# Patient Record
Sex: Female | Born: 1980 | Race: Black or African American | Marital: Married | Smoking: Never smoker
Health system: Southern US, Community
[De-identification: ages and names within clinical notes are randomized; demographics above are authoritative.]

## PROBLEM LIST (undated history)

## (undated) ENCOUNTER — Inpatient Hospital Stay (HOSPITAL_COMMUNITY): Payer: Self-pay

## (undated) DIAGNOSIS — N83209 Unspecified ovarian cyst, unspecified side: Secondary | ICD-10-CM

## (undated) DIAGNOSIS — O3480 Maternal care for other abnormalities of pelvic organs, unspecified trimester: Secondary | ICD-10-CM

---

## 2011-03-12 ENCOUNTER — Ambulatory Visit
Admission: RE | Admit: 2011-03-12 | Discharge: 2011-03-12 | Disposition: A | Discharge status: Home / Self Care | Payer: No Typology Code available for payment source | Source: Ambulatory Visit | Attending: Infectious Diseases | Admitting: Infectious Diseases

## 2011-03-12 ENCOUNTER — Other Ambulatory Visit: Payer: Self-pay | Admitting: Infectious Diseases

## 2011-03-12 DIAGNOSIS — Z139 Encounter for screening, unspecified: Secondary | ICD-10-CM

## 2011-07-28 ENCOUNTER — Inpatient Hospital Stay (HOSPITAL_COMMUNITY)
Admission: AD | Admit: 2011-07-28 | Discharge: 2011-07-28 | Disposition: A | Discharge status: Home / Self Care | Payer: BC Managed Care – PPO | Source: Ambulatory Visit | Attending: Obstetrics & Gynecology | Admitting: Obstetrics & Gynecology

## 2011-07-28 ENCOUNTER — Encounter (HOSPITAL_COMMUNITY): Payer: Self-pay | Admitting: *Deleted

## 2011-07-28 ENCOUNTER — Inpatient Hospital Stay (HOSPITAL_COMMUNITY): Payer: BC Managed Care – PPO

## 2011-07-28 DIAGNOSIS — N83209 Unspecified ovarian cyst, unspecified side: Secondary | ICD-10-CM

## 2011-07-28 DIAGNOSIS — O34599 Maternal care for other abnormalities of gravid uterus, unspecified trimester: Secondary | ICD-10-CM | POA: Insufficient documentation

## 2011-07-28 DIAGNOSIS — Z1389 Encounter for screening for other disorder: Secondary | ICD-10-CM

## 2011-07-28 DIAGNOSIS — Z363 Encounter for antenatal screening for malformations: Secondary | ICD-10-CM

## 2011-07-28 DIAGNOSIS — Z3490 Encounter for supervision of normal pregnancy, unspecified, unspecified trimester: Secondary | ICD-10-CM

## 2011-07-28 DIAGNOSIS — N83202 Unspecified ovarian cyst, left side: Secondary | ICD-10-CM

## 2011-07-28 DIAGNOSIS — R109 Unspecified abdominal pain: Secondary | ICD-10-CM | POA: Insufficient documentation

## 2011-07-28 LAB — WET PREP, GENITAL: Trich, Wet Prep: NONE SEEN

## 2011-07-28 LAB — CBC
HCT: 35.2 % — ABNORMAL LOW (ref 36.0–46.0)
MCHC: 31.8 g/dL (ref 30.0–36.0)
RDW: 13.6 % (ref 11.5–15.5)

## 2011-07-28 LAB — URINALYSIS, ROUTINE W REFLEX MICROSCOPIC
Bilirubin Urine: NEGATIVE
Hgb urine dipstick: NEGATIVE
Specific Gravity, Urine: 1.01 (ref 1.005–1.030)
pH: 6 (ref 5.0–8.0)

## 2011-07-28 LAB — POCT PREGNANCY, URINE
Preg Test, Ur: POSITIVE
Preg Test, Ur: POSITIVE

## 2011-07-28 NOTE — ED Provider Notes (Signed)
History   Pt presents today c/o lower abd pain that has been present for the past 2wks. She states she has noticed worsening pain in the lower quadrants. She denies vag dc or bleeding.  Chief Complaint  Patient presents with  . Abdominal Pain   HPI  OB History    Grav Para Term Preterm Abortions TAB SAB Ect Mult Living   3 1              Past Medical History  Diagnosis Date  . No pertinent past medical history     Past Surgical History  Procedure Date  . Cesarean section     No family history on file.  History  Substance Use Topics  . Smoking status: Not on file  . Smokeless tobacco: Not on file  . Alcohol Use: Not on file    Allergies: No Known Allergies  Prescriptions prior to admission  Medication Sig Dispense Refill  . prenatal vitamin w/FE, FA (PRENATAL 1 + 1) 27-1 MG TABS Take 1 tablet by mouth daily.          Review of Systems  Constitutional: Negative for fever and chills.  Cardiovascular: Negative for chest pain.  Gastrointestinal: Positive for abdominal pain. Negative for nausea, vomiting, diarrhea and constipation.  Genitourinary: Negative for dysuria, urgency, frequency and hematuria.  Neurological: Negative for dizziness and headaches.  Psychiatric/Behavioral: Negative for depression and suicidal ideas.   Physical Exam   Blood pressure 126/65, pulse 78, temperature 97.7 F (36.5 C), temperature source Oral, resp. rate 16, height 5' 6.5" (1.689 m), weight 167 lb 12.8 oz (76.114 kg), last menstrual period 06/10/2011, SpO2 100.00%.  Physical Exam  Constitutional: She is oriented to person, place, and time. She appears well-developed and well-nourished. No distress.  HENT:  Head: Normocephalic and atraumatic.  Eyes: EOM are normal. Pupils are equal, round, and reactive to light.  GI: Soft. She exhibits no distension. There is tenderness. There is no rebound and no guarding.  Genitourinary: No tenderness or bleeding around the vagina. Vaginal  discharge found.       Cervix Lg/closed. Pt is slightly tender in both adnexa.  Neurological: She is alert and oriented to person, place, and time.  Skin: Skin is warm and dry. She is not diaphoretic.  Psychiatric: She has a normal mood and affect. Her behavior is normal. Judgment and thought content normal.    MAU Course  Procedures  Results for orders placed during the hospital encounter of 07/28/11 (from the past 24 hour(s))  URINALYSIS, ROUTINE W REFLEX MICROSCOPIC     Status: Normal   Collection Time   07/28/11 10:40 AM      Component Value Range   Color, Urine YELLOW  YELLOW    Appearance CLEAR  CLEAR    Specific Gravity, Urine 1.010  1.005 - 1.030    pH 6.0  5.0 - 8.0    Glucose, UA NEGATIVE  NEGATIVE (mg/dL)   Hgb urine dipstick NEGATIVE  NEGATIVE    Bilirubin Urine NEGATIVE  NEGATIVE    Ketones, ur NEGATIVE  NEGATIVE (mg/dL)   Protein, ur NEGATIVE  NEGATIVE (mg/dL)   Urobilinogen, UA 0.2  0.0 - 1.0 (mg/dL)   Nitrite NEGATIVE  NEGATIVE    Leukocytes, UA NEGATIVE  NEGATIVE   POCT PREGNANCY, URINE     Status: Normal   Collection Time   07/28/11 10:43 AM      Component Value Range   Preg Test, Ur POSITIVE    POCT PREGNANCY,  URINE     Status: Normal   Collection Time   07/28/11 10:44 AM      Component Value Range   Preg Test, Ur POSITIVE    CBC     Status: Abnormal   Collection Time   07/28/11 10:51 AM      Component Value Range   WBC 4.5  4.0 - 10.5 (K/uL)   RBC 4.30  3.87 - 5.11 (MIL/uL)   Hemoglobin 11.2 (*) 12.0 - 15.0 (g/dL)   HCT 40.9 (*) 81.1 - 46.0 (%)   MCV 81.9  78.0 - 100.0 (fL)   MCH 26.0  26.0 - 34.0 (pg)   MCHC 31.8  30.0 - 36.0 (g/dL)   RDW 91.4  78.2 - 95.6 (%)   Platelets 238  150 - 400 (K/uL)  WET PREP, GENITAL     Status: Abnormal   Collection Time   07/28/11 11:00 AM      Component Value Range   Yeast, Wet Prep NONE SEEN  NONE SEEN    Trich, Wet Prep NONE SEEN  NONE SEEN    Clue Cells, Wet Prep FEW (*) NONE SEEN    WBC, Wet Prep HPF POC  MODERATE (*) NONE SEEN   HCG, QUANTITATIVE, PREGNANCY     Status: Abnormal   Collection Time   07/28/11 11:00 AM      Component Value Range   hCG, Beta Chain, Quant, S 21308 (*) <5 (mIU/mL)   US shows single IUP at 6.5wks with EDD of 03/18/12. Also there is a large 8.7cm septated left ovarian mass. Recommend repeat US in 6wks.  Assessment and Plan  Pain in preg: pt with large septated left ovarian cyst. Will repeat US in 6wks. Discussed diet, activity, risks, and precautions.  Clinton Gallant. Kamoni Gentles III, DrHSc, MPAS, PA-C  07/28/2011, 11:27 AM

## 2011-07-28 NOTE — Progress Notes (Signed)
Lower abd pain x 2 weeks, UPT positive

## 2011-07-28 NOTE — Progress Notes (Signed)
Pt states she started having lower abdominal pain about 2 weeks ago. Sometimes nauseated but no vomiting. No bleeding or discharge.

## 2011-07-29 LAB — GC/CHLAMYDIA PROBE AMP, GENITAL
Chlamydia, DNA Probe: NEGATIVE
GC Probe Amp, Genital: NEGATIVE

## 2011-08-02 NOTE — ED Provider Notes (Signed)
Agree with above note.  Sheena Hernandez H. 08/02/2011 5:08 PM

## 2011-08-14 DIAGNOSIS — N83209 Unspecified ovarian cyst, unspecified side: Secondary | ICD-10-CM

## 2011-08-14 HISTORY — DX: Unspecified ovarian cyst, unspecified side: N83.209

## 2011-08-15 ENCOUNTER — Other Ambulatory Visit: Payer: Self-pay | Admitting: Physician Assistant

## 2011-08-17 ENCOUNTER — Encounter (HOSPITAL_COMMUNITY): Payer: Self-pay | Admitting: Anesthesiology

## 2011-08-17 ENCOUNTER — Observation Stay (HOSPITAL_COMMUNITY)
Admission: AD | Admit: 2011-08-17 | Discharge: 2011-08-18 | Disposition: A | Discharge status: Home / Self Care | Payer: Medicaid Other | Source: Ambulatory Visit | Attending: Obstetrics & Gynecology | Admitting: Obstetrics & Gynecology

## 2011-08-17 ENCOUNTER — Encounter (HOSPITAL_COMMUNITY): Payer: Self-pay | Admitting: *Deleted

## 2011-08-17 ENCOUNTER — Inpatient Hospital Stay (HOSPITAL_COMMUNITY): Payer: Medicaid Other

## 2011-08-17 DIAGNOSIS — N83209 Unspecified ovarian cyst, unspecified side: Secondary | ICD-10-CM | POA: Insufficient documentation

## 2011-08-17 DIAGNOSIS — N83202 Unspecified ovarian cyst, left side: Secondary | ICD-10-CM

## 2011-08-17 DIAGNOSIS — O34599 Maternal care for other abnormalities of gravid uterus, unspecified trimester: Principal | ICD-10-CM | POA: Insufficient documentation

## 2011-08-17 DIAGNOSIS — R1032 Left lower quadrant pain: Secondary | ICD-10-CM | POA: Insufficient documentation

## 2011-08-17 DIAGNOSIS — N949 Unspecified condition associated with female genital organs and menstrual cycle: Secondary | ICD-10-CM | POA: Insufficient documentation

## 2011-08-17 DIAGNOSIS — Z3490 Encounter for supervision of normal pregnancy, unspecified, unspecified trimester: Secondary | ICD-10-CM

## 2011-08-17 MED ORDER — MORPHINE SULFATE 4 MG/ML IJ SOLN
4.0000 mg | Freq: Once | INTRAMUSCULAR | Status: AC
  Administered 2011-08-17: 4 mg via INTRAVENOUS
  Filled 2011-08-17: qty 1

## 2011-08-17 MED ORDER — CEFAZOLIN SODIUM 1-5 GM-% IV SOLN
1.0000 g | INTRAVENOUS | Status: DC
  Filled 2011-08-17: qty 50

## 2011-08-17 MED ORDER — OXYCODONE-ACETAMINOPHEN 5-325 MG PO TABS: 2.0000 | ORAL_TABLET | ORAL | Status: DC | PRN

## 2011-08-17 MED ORDER — DEXTROSE IN LACTATED RINGERS 5 % IV SOLN
INTRAVENOUS | Status: DC
  Administered 2011-08-17 – 2011-08-18 (×2): via INTRAVENOUS

## 2011-08-17 MED ORDER — MORPHINE SULFATE 4 MG/ML IJ SOLN
3.0000 mg | INTRAMUSCULAR | Status: DC | PRN
  Filled 2011-08-17: qty 1

## 2011-08-17 MED ORDER — MORPHINE SULFATE 4 MG/ML IJ SOLN
4.0000 mg | Freq: Once | INTRAMUSCULAR | Status: AC
  Administered 2011-08-17: 4 mg via INTRAVENOUS

## 2011-08-17 MED ORDER — MORPHINE SULFATE 4 MG/ML IJ SOLN
2.0000 mg | Freq: Once | INTRAMUSCULAR | Status: DC
  Filled 2011-08-17: qty 1

## 2011-08-17 MED ORDER — ONDANSETRON 4 MG PO TBDP
4.0000 mg | ORAL_TABLET | Freq: Once | ORAL | Status: AC
  Administered 2011-08-17: 4 mg via ORAL
  Filled 2011-08-17: qty 1

## 2011-08-17 MED ORDER — SODIUM CHLORIDE 0.9 % IJ SOLN
INTRAMUSCULAR | Status: AC
  Filled 2011-08-17: qty 6

## 2011-08-17 NOTE — Progress Notes (Signed)
DR. [name] AT BEDSIDE RISKS OF SURGERY EXPLAINED TO PT. PT WILL LET RN KNOW IF SHE WANTS TO BE ADMITTED FOR OBSERVATION AND HAVE SURGERY IN AM. PT VERBALIZED UNDERSTANDING.

## 2011-08-17 NOTE — Progress Notes (Signed)
PT REQUESTING TO SEE DR. DOVE AND TALK ABOUT RISK OF SURGERY. DR. [name] NOTIFIED OF PT CONCERN AND WILL COME TO MAU TO TALK WITH PT.

## 2011-08-17 NOTE — Progress Notes (Signed)
Pt c/o lower abdominal pain that started today. No vaginal bleeding or discharge.

## 2011-08-17 NOTE — ED Provider Notes (Addendum)
Subjective  The patient is a 30 year old G4 P2002 at 9 weeks 5 days presenting with acute onset severe left lower quadrant pain about 30 minutes prior to arrival. She had a similar pain episode of less severe when she was seen here on 07/28/2011 and her ultrasound revealed a viable 6 week 6 day viable IUP and a large septated left ovarian cyst measuring 8.7 cm x 6.7 cm. There is a trace of free fluid. Since that visit she has continued to have mild lower abdominal pain but this is the worst it has been. No vaginal bleeding.  Past Medical History  Diagnosis Date  . No pertinent past medical history    Past Surgical History  Procedure Date  . Cesarean section    History   Social History  . Marital Status: Married    Spouse Name: N/A    Number of Children: N/A  . Years of Education: N/A   Social History Main Topics  . Smoking status: Never Smoker   . Smokeless tobacco: Not on file  . Alcohol Use: No  . Drug Use: No  . Sexually Active: Yes   Other Topics Concern  . Not on file   Social History Narrative  . No narrative on file   Review of symptoms: Pertinent in items in history of present illness  Objective Filed Vitals:   08/17/11 1916  BP: 103/84  Pulse: 67  Temp: 97.7 F (36.5 C)  Resp: 20   Physical exam  General: The patient is writhing on the floor in apparent pain and unable to lay on the bed for bedside ultrasound. Abd: soft, moderately TTP L>RLQ  [place]: Left ovarian cysts increased in size to 12x11x5 cm and there is blood flow ruling out complete torsion.  C/W Dr. [name] who will see pt.  I saw the patient and her husband.  I explained that since her cyst has grown since the last ultrasound, that I would recommend surgery. I offered to do it now versus overnight stay and operate in the AM.  She and her husband feel that this is "abrupt", and they have deferred not only surgery, but also admission. I have suggested that instead of the health dept., that she should  call [facility] in the AM and get an appt. I told her to come back to the MAU anytime that she feels her pain is worse.    Now (2117) she has decided that she would like overnight observation and to have her surgery tomorrow.  I have certainly agreed and the OR says this can be done at 1145am.

## 2011-08-17 NOTE — H&P (Signed)
  Sheena Hernandez C. Sheena Potter, MD Physician Signed Obstetrics ED Provider Notes 08/17/2011 6:41 PM  Subjective   The patient is a 30 year old G4 P2002 at 9 weeks 5 days presenting with acute onset severe left lower quadrant pain about 30 minutes prior to arrival. She had a similar pain episode of less severe when she was seen here on 07/28/2011 and her ultrasound revealed a viable 6 week 6 day viable IUP and a large septated left ovarian cyst measuring 8.7 cm x 6.7 cm. There is a trace of free fluid. Since that visit she has continued to have mild lower abdominal pain but this is the worst it has been. No vaginal bleeding.    Past Medical History   Diagnosis  Date   .  No pertinent past medical history      Past Surgical History   Procedure  Date   .  Cesarean section      History       Social History   .  Marital Status:  Married       Spouse Name:  N/A       Number of Children:  N/A   .  Years of Education:  N/A       Social History Main Topics   .  Smoking status:  Never Smoker    .  Smokeless tobacco:  Not on file   .  Alcohol Use:  No   .  Drug Use:  No   .  Sexually Active:  Yes       Other Topics  Concern   .  Not on file       Social History Narrative   .  No narrative on file    Review of symptoms: Pertinent in items in history of present illness   Objective Filed Vitals:     08/17/11 1916   BP:  103/84   Pulse:  67   Temp:  97.7 F (36.5 C)   Resp:  20    Physical exam   General: The patient is writhing on the floor in apparent pain and unable to lay on the bed for bedside ultrasound. Abd: soft, moderately TTP L>RLQ   [place]: Left ovarian cysts increased in size to 12x11x5 cm and there is blood flow ruling out complete torsion.   C/W Dr. Marice Hernandez who will see pt.   I saw the patient and her husband.  I explained that since her cyst has grown since the last ultrasound, that I would recommend surgery. I offered to do it now versus overnight stay and operate in the AM.   She and her husband feel that this is "abrupt", and they have deferred not only surgery, but also admission. I have suggested that instead of the health dept., that she should call the [facility] in the AM and get an appt. I told her to come back to the MAU anytime that she feels her pain is worse.   As of 2117, the patient and her husband have decided that they will accept overnight observation and surgery in the AM.  The OR manager Elnita Maxwell say that an 1145 time slot is available.

## 2011-08-18 ENCOUNTER — Encounter: Payer: Self-pay | Admitting: Advanced Practice Midwife

## 2011-08-18 ENCOUNTER — Encounter (HOSPITAL_COMMUNITY)
Admission: AD | Disposition: A | Discharge status: Home / Self Care | Payer: Self-pay | Source: Ambulatory Visit | Attending: Obstetrics & Gynecology

## 2011-08-18 DIAGNOSIS — Z3490 Encounter for supervision of normal pregnancy, unspecified, unspecified trimester: Secondary | ICD-10-CM

## 2011-08-18 DIAGNOSIS — N83209 Unspecified ovarian cyst, unspecified side: Secondary | ICD-10-CM

## 2011-08-18 LAB — COMPREHENSIVE METABOLIC PANEL
ALT: 15 U/L (ref 0–35)
AST: 14 U/L (ref 0–37)
CO2: 25 mEq/L (ref 19–32)
Calcium: 9 mg/dL (ref 8.4–10.5)
GFR calc non Af Amer: >60 mL/min (ref >60)
Sodium: 134 mEq/L — ABNORMAL LOW (ref 135–145)

## 2011-08-18 LAB — CBC
MCH: 26.1 pg (ref 26.0–34.0)
Platelets: 261 10*3/uL (ref 150–400)
RBC: 4.18 MIL/uL (ref 3.87–5.11)
WBC: 8 10*3/uL (ref 4.0–10.5)

## 2011-08-18 MED ORDER — ONDANSETRON HCL 4 MG/2ML IJ SOLN
INTRAMUSCULAR | Status: AC
  Filled 2011-08-18: qty 2

## 2011-08-18 MED ORDER — LIDOCAINE HCL (CARDIAC) 20 MG/ML IV SOLN
INTRAVENOUS | Status: AC
  Filled 2011-08-18: qty 5

## 2011-08-18 MED ORDER — FENTANYL CITRATE 0.05 MG/ML IJ SOLN
INTRAMUSCULAR | Status: AC
  Filled 2011-08-18: qty 10

## 2011-08-18 MED ORDER — OXYCODONE-ACETAMINOPHEN 5-325 MG PO TABS: 1.0000 | ORAL_TABLET | ORAL | Status: AC | PRN

## 2011-08-18 MED ORDER — PROPOFOL 10 MG/ML IV EMUL
INTRAVENOUS | Status: AC
  Filled 2011-08-18: qty 20

## 2011-08-18 MED ORDER — ROCURONIUM BROMIDE 50 MG/5ML IV SOLN
INTRAVENOUS | Status: AC
  Filled 2011-08-18: qty 1

## 2011-08-18 MED ORDER — MIDAZOLAM HCL 2 MG/2ML IJ SOLN
INTRAMUSCULAR | Status: AC
  Filled 2011-08-18: qty 2

## 2011-08-18 MED ORDER — DEXAMETHASONE SODIUM PHOSPHATE 10 MG/ML IJ SOLN
INTRAMUSCULAR | Status: AC
  Filled 2011-08-18: qty 1

## 2011-08-18 NOTE — Progress Notes (Signed)
UR chart review completed.  

## 2011-08-18 NOTE — Progress Notes (Signed)
Subjective: Patient reports no problems voiding.  Her pain is somewhat reduced  Objective: I have reviewed patient's vital signs, intake and output, medications and labs.  General: alert Resp: clear to auscultation bilaterally Cardio: regular rate and rhythm, S1, S2 normal, no murmur, click, rub or gallop GI: soft, non-tender; bowel sounds normal; no masses,  no organomegaly and normal findings: bowel sounds normal   Assessment/Plan: 12 cm ovarian cyst at 9 wk 6 days EGA- She is scheduled for a L/S and ovarian cystectomy today.  LOS: 1 day    Tam Delisle C. 08/18/2011, 7:36 AM

## 2011-08-18 NOTE — Discharge Summary (Signed)
Discharge Summary Reason for Admission: left ovarian cyst with severe pain  29 y.o. G4P2002 at [redacted]w[redacted]d. Pt presented to MAU on 08/17/11 with severe pelvic pain. Was seen on 07/28/11 with same complaint and found to have left ovarian cyst measuring 8.7 x 6.7 cm, either one septated cyst or two separate cysts. U/S yesterday revealed that the cyst had increased in size to 12 x 5 x 11 cm, ovarian torsion was ruled out. Pt was offered surgical intervention for ovarian cyst, initially consented, but then declined this morning as her pain is currently resolved. During her admission, she received IV morphine for pain.   Hemoglobin  Date Value Range Status  08/18/2011 10.9* 12.0-15.0 (g/dL) Final     HCT  Date Value Range Status  08/18/2011 33.9* 36.0-46.0 (%) Final    Discharge Diagnoses: IUP at 9.6 wks, left ovarian cyst  Discharge Information: Date: 08/18/2011 Activity: unrestricted Diet: routine Medications: Percocet Condition: stable Instructions: Follow up in clinic in 1 week, repeat u/s in 2 weeks, return to MAU with severe pain Discharge to: home Follow-up Information    Follow up with [facility] on 08/25/2011. (9:30 AM,  [phone])    Contact information:   [address] [place] [id number]            FRAZIER,NATALIE 08/18/2011, 10:30 AM

## 2011-08-18 NOTE — Progress Notes (Signed)
  U/S reviewed with Rads.  Pt. Is w/o pain, on no pain meds overnight and does not want surgery.  She is in the 1st trimester and has flow to her enlarged ovary.  Discussed risk of torsion, possible resolution of cyst and safety of surgery in second trimester.  After careful consideration, pt. Opts to forego surgery at this time.  Will f/u u/s in 2 wks, and plan definitive surgery, if needed at that time.  She is reminded to return to hospital if pain worsens. She will be discharged with some pain meds. And instructions given.

## 2011-08-18 NOTE — Anesthesia Preprocedure Evaluation (Deleted)
Anesthesia Evaluation  Name, MR# and DOB Patient awake  General Assessment Comment  Reviewed: Allergy & Precautions, H&P , Patient's Chart, lab work & pertinent test results, reviewed documented beta blocker date and time   Airway Mallampati: II TM Distance: >3 FB Neck ROM: full    Dental No notable dental hx.    Pulmonary  clear to auscultation  pulmonary exam normalPulmonary Exam Normal breath sounds clear to auscultation none    Cardiovascular regular Normal    Neuro/Psych   GI/Hepatic/Renal   Endo/Other    Abdominal   Musculoskeletal   Hematology   Peds  Reproductive/Obstetrics    Anesthesia Other Findings             Anesthesia Physical Anesthesia Plan  ASA: II  Anesthesia Plan: General   Post-op Pain Management:    Induction: Intravenous  Airway Management Planned: Oral ETT  Additional Equipment:   Intra-op Plan:   Post-operative Plan:   Informed Consent: I have reviewed the patients History and Physical, chart, labs and discussed the procedure including the risks, benefits and alternatives for the proposed anesthesia with the patient or authorized representative who has indicated his/her understanding and acceptance.   Dental Advisory Given  Plan Discussed with: CRNA and Surgeon  Anesthesia Plan Comments: (  Discussed  general anesthesia, including possible nausea, instrumentation of airway, sore throat,pulmonary aspiration, etc. I asked if the were any outstanding questions, or  concerns before we proceeded. )        Anesthesia Quick Evaluation  

## 2011-08-19 ENCOUNTER — Encounter (HOSPITAL_COMMUNITY): Payer: Self-pay | Admitting: Family Medicine

## 2011-08-22 ENCOUNTER — Inpatient Hospital Stay (HOSPITAL_COMMUNITY): Payer: Medicaid Other

## 2011-08-22 ENCOUNTER — Encounter (HOSPITAL_COMMUNITY): Payer: Self-pay | Admitting: *Deleted

## 2011-08-22 ENCOUNTER — Inpatient Hospital Stay (HOSPITAL_COMMUNITY)
Admission: AD | Admit: 2011-08-22 | Discharge: 2011-08-22 | Disposition: A | Discharge status: Home / Self Care | Payer: Medicaid Other | Source: Ambulatory Visit | Attending: Obstetrics & Gynecology | Admitting: Obstetrics & Gynecology

## 2011-08-22 DIAGNOSIS — O039 Complete or unspecified spontaneous abortion without complication: Secondary | ICD-10-CM

## 2011-08-22 DIAGNOSIS — O34599 Maternal care for other abnormalities of gravid uterus, unspecified trimester: Secondary | ICD-10-CM | POA: Insufficient documentation

## 2011-08-22 DIAGNOSIS — N83209 Unspecified ovarian cyst, unspecified side: Secondary | ICD-10-CM | POA: Insufficient documentation

## 2011-08-22 LAB — URINE MICROSCOPIC-ADD ON

## 2011-08-22 LAB — CBC
Platelets: 227 10*3/uL (ref 150–400)
RBC: 4.55 MIL/uL (ref 3.87–5.11)
WBC: 5 10*3/uL (ref 4.0–10.5)

## 2011-08-22 LAB — URINALYSIS, ROUTINE W REFLEX MICROSCOPIC
Bilirubin Urine: NEGATIVE
Glucose, UA: NEGATIVE mg/dL
Ketones, ur: NEGATIVE mg/dL
Leukocytes, UA: NEGATIVE
Nitrite: NEGATIVE
Protein, ur: 30 mg/dL — AB
Specific Gravity, Urine: <1.005 — ABNORMAL LOW (ref 1.005–1.030)
Urobilinogen, UA: 0.2 mg/dL (ref 0.0–1.0)
pH: 6 (ref 5.0–8.0)

## 2011-08-22 MED ORDER — OXYCODONE-ACETAMINOPHEN 10-325 MG PO TABS: 1.0000 | ORAL_TABLET | Freq: Four times a day (QID) | ORAL | Status: AC | PRN

## 2011-08-22 MED ORDER — KETOROLAC TROMETHAMINE 60 MG/2ML IM SOLN
60.0000 mg | Freq: Once | INTRAMUSCULAR | Status: AC
  Administered 2011-08-22: 60 mg via INTRAMUSCULAR
  Filled 2011-08-22: qty 2

## 2011-08-22 NOTE — ED Provider Notes (Signed)
Chief Complaint:  Abdominal Pain and Vaginal Bleeding   Sheena Hernandez is  30 y.o. [id number].  Patient's last menstrual period was 06/10/2011.Marland Kitchen  Her pregnancy status is positive. [redacted]w[redacted]d She presents complaining of Abdominal Pain and Vaginal Bleeding . Onset is described as sudden and has been present for  4 hours. Reports lower abd pain and bleeding with clots since ~ 2am, states took oxycodone without relief. Pt with known left adnexal cyst measuring 12cm. Reports pain today different than previous cyst pain.   Obstetrical/Gynecological History: OB History    Grav Para Term Preterm Abortions TAB SAB Ect Mult Living   4 3 2       2       Past Medical History: Past Medical History  Diagnosis Date  . No pertinent past medical history     Past Surgical History: Past Surgical History  Procedure Date  . Cesarean section   . Laparoscopy 08/18/2011    Procedure: LAPAROSCOPY OPERATIVE;  Surgeon: Reva Bores, MD;  Location: [facility];  Service: Gynecology;  Laterality: N/A;    Family History: No family history on file.  Social History: History  Substance Use Topics  . Smoking status: Never Smoker   . Smokeless tobacco: Not on file  . Alcohol Use: No    Allergies: No Known Allergies  Prescriptions prior to admission  Medication Sig Dispense Refill  . oxyCODONE-acetaminophen (PERCOCET) 5-325 MG per tablet Take 1-2 tablets by mouth every 4 (four) hours as needed for pain.  15 tablet  0  . prenatal vitamin w/FE, FA (PRENATAL 1 + 1) 27-1 MG TABS Take 1 tablet by mouth daily.         Review of Systems - History obtained from the patient General ROS: negative Gastrointestinal ROS: positive for - abdominal pain Genito-Urinary ROS: positive for - vaginal bleeding  Physical Exam   Blood pressure 113/76, pulse 91, temperature 98.8 F (37.1 C), resp. rate 20, height 5' 6.5" (1.689 m), weight 70.943 kg (156 lb 6.4 oz), last menstrual period 06/10/2011.  General: General appearance -  alert, well appearing, and in no distress and normal appearing weight Mental status - alert, oriented to person, place, and time, normal mood, behavior, speech, dress, motor activity, and thought processes, affect appropriate to mood Abdomen - soft, nontender, nondistended, no masses or organomegaly Focused Gynecological Exam: VULVA: blood stained, VAGINA: large amount of blood and tissue in vault, CERVIX: probable placental tissue removed from cervix, cervix dilated 1.5cm, UTERUS: enlarged, nontender, ADNEXA: mass present left side, size 12 cm  Labs: Recent Results (from the past 24 hour(s))  URINALYSIS, ROUTINE W REFLEX MICROSCOPIC   Collection Time   08/22/11  5:45 AM      Component Value Range   Color, Urine RED (*) YELLOW    Appearance CLEAR  CLEAR    Specific Gravity, Urine <1.005 (*) 1.005 - 1.030    pH 6.0  5.0 - 8.0    Glucose, UA NEGATIVE  NEGATIVE (mg/dL)   Hgb urine dipstick LARGE (*) NEGATIVE    Bilirubin Urine NEGATIVE  NEGATIVE    Ketones, ur NEGATIVE  NEGATIVE (mg/dL)   Protein, ur 30 (*) NEGATIVE (mg/dL)   Urobilinogen, UA 0.2  0.0 - 1.0 (mg/dL)   Nitrite NEGATIVE  NEGATIVE    Leukocytes, UA NEGATIVE  NEGATIVE   URINE MICROSCOPIC-ADD ON   Collection Time   08/22/11  5:45 AM      Component Value Range   Squamous Epithelial / LPF FEW (*) RARE  WBC, UA 0-2  <3 (WBC/hpf)   RBC / HPF 7-10  <3 (RBC/hpf)   Bacteria, UA FEW (*) RARE    Imaging Studies:  *RADIOLOGY REPORT*  Clinical Data: Evaluate for retained products of conception  TRANSVAGINAL OBSTETRIC US  Technique: Transvaginal ultrasound was performed for complete  evaluation of the gestation as well as the maternal uterus, adnexal  regions, and pelvic cul-de-sac.  Comparison: 08/17/2011  Intrauterine gestational sac: Not visualized.  Maternal uterus/adnexae:  Uterus measures 5.8 x 6.2 x 9.8 cm.  Endometrium is heterogeneous, measuring 2.4 cm in thickness,  without evidence of focal Doppler flow.  Right ovary  is not discretely visualized.  Left ovary is notable for two simple appearing cysts measuring 5.8  x 3.7 x 4.3 cm and 6.6 x 4.9 x 5.4 cm.  IMPRESSION:  No intrauterine gestational sac visualized.  Heterogeneous endometrium, measuring 2.4 cm in thickness, without  focal Doppler flow. This appearance is suspicious for nonvascular  retained products of conception.  Original Report Authenticated By: Charline Bills, M.D.   Assessment: Complete SAB Stable left ovarian cysts  Plan: Discharge to home Rx Percocet FU in [facility], clinic staff will call with your appt  Sheena Hernandez E. 08/22/2011,6:35 AM

## 2011-08-22 NOTE — ED Notes (Signed)
Pt to u/s via w/c

## 2011-08-22 NOTE — ED Notes (Signed)
[address] CNM in. Spec exam done and POC removed from cervix and sent to lab. Pt tol well.

## 2011-08-22 NOTE — ED Notes (Signed)
[address] CNM in to see pt

## 2011-08-22 NOTE — ED Notes (Signed)
[address] CNM notified of pt's admission and status. Will see pt

## 2011-08-22 NOTE — Progress Notes (Signed)
G3P2 at 4 wks. Bleeding and cramping since 2400. Small amt bleeding with some clots

## 2011-09-01 ENCOUNTER — Ambulatory Visit: Payer: Self-pay | Admitting: Physician Assistant

## 2011-09-01 ENCOUNTER — Ambulatory Visit (INDEPENDENT_AMBULATORY_CARE_PROVIDER_SITE_OTHER): Payer: Medicaid Other | Admitting: Physician Assistant

## 2011-09-01 DIAGNOSIS — N83202 Unspecified ovarian cyst, left side: Secondary | ICD-10-CM

## 2011-09-01 DIAGNOSIS — N83209 Unspecified ovarian cyst, unspecified side: Secondary | ICD-10-CM

## 2011-09-01 DIAGNOSIS — O039 Complete or unspecified spontaneous abortion without complication: Secondary | ICD-10-CM

## 2011-09-01 LAB — HCG, QUANTITATIVE, PREGNANCY: hCG, Beta Chain, Quant, S: 944.5 m[IU]/mL

## 2011-09-01 NOTE — Progress Notes (Signed)
Pt c/o some spotting

## 2011-09-01 NOTE — Progress Notes (Signed)
Chief Complaint:  Follow-up   Sheena Hernandez is  30 y.o. [id number].  Patient's last menstrual period was 06/10/2011..   She presents complaining for follow-up after complete AB on 08/15/2011. No complaints today. Feels that she is grieving appropriately. Reports scant vaginal bleeding. Denies fever, chills, and foul-smelling discharge. Pt with known left ovarian cysts - stable on [place], denies pain.  Obstetrical/Gynecological History: OB History    Grav Para Term Preterm Abortions TAB SAB Ect Mult Living   4 3 2       2       Past Medical History: Past Medical History  Diagnosis Date  . No pertinent past medical history     Past Surgical History: Past Surgical History  Procedure Date  . Cesarean section   . Laparoscopy 08/18/2011    Procedure: LAPAROSCOPY OPERATIVE;  Surgeon: Reva Bores, MD;  Location: [facility];  Service: Gynecology;  Laterality: N/A;    Family History: No family history on file.  Social History: History  Substance Use Topics  . Smoking status: Never Smoker   . Smokeless tobacco: Not on file  . Alcohol Use: No    Allergies: No Known Allergies   Review of Systems - Negative except what has been reviewed in the HPI  Physical Exam   Blood pressure 117/78, pulse 84, temperature 98.9 F (37.2 C), temperature source Oral, height 5\' 5"  (1.651 m), weight 156 lb 1.6 oz (70.806 kg), last menstrual period 06/10/2011.  General: General appearance - alert, well appearing, and in no distress, oriented to person, place, and time and normal appearing weight Mental status - alert, oriented to person, place, and time, normal mood, behavior, speech, dress, motor activity, and thought processes, affect appropriate to mood Back exam - full range of motion, no tenderness, palpable spasm or pain on motion Focused Gynecological Exam: examination not indicated  Labs: 08/22/11 Pathology Report: CHORIONIC VILLI, CONSISTENT WITH PRODUCTS OF CONCEPTION.  Imaging Studies:   US Ob  Transvaginal  08/22/2011  *RADIOLOGY REPORT*  Clinical Data: Evaluate for retained products of conception  TRANSVAGINAL OBSTETRIC US  Technique:  Transvaginal ultrasound was performed for complete evaluation of the gestation as well as the maternal uterus, adnexal regions, and pelvic cul-de-sac.  Comparison:  08/17/2011  Intrauterine gestational sac: Not visualized.  Maternal uterus/adnexae: Uterus measures 5.8 x 6.2 x 9.8 cm.  Endometrium is heterogeneous, measuring 2.4 cm in thickness, without evidence of focal Doppler flow.  Right ovary is not discretely visualized.  Left ovary is notable for two simple appearing cysts measuring 5.8 x 3.7 x 4.3 cm and 6.6 x 4.9 x 5.4 cm.  IMPRESSION: No intrauterine gestational sac visualized.  Heterogeneous endometrium, measuring 2.4 cm in thickness, without focal Doppler flow.  This appearance is suspicious for nonvascular retained products of conception.  Original Report Authenticated By: Charline Bills, M.D.   [place] Art/ven Flow Abd Pelv Doppler  08/17/2011  OBSTETRICAL ULTRASOUND: This exam was performed within a [facility]. The OB US report was generated in the [facility] system, and faxed to the ordering physician.   This report is also available in [employer] and in the [employer]. See [facility] Obstetric US report.     Assessment: Complete SAB Left ovarian cysts- stable  Plan: Will repeat pelvic US to re-eval ovarian cyst in 6-8 weeks Continue PNV Recommend delaying conception until 3 normal periods  Taylor Levick E. 09/01/2011,3:43 PM

## 2011-09-08 ENCOUNTER — Ambulatory Visit (HOSPITAL_COMMUNITY): Payer: Self-pay

## 2011-10-06 ENCOUNTER — Ambulatory Visit (HOSPITAL_COMMUNITY)
Admission: RE | Admit: 2011-10-06 | Discharge: 2011-10-06 | Disposition: A | Discharge status: Home / Self Care | Payer: BC Managed Care – PPO | Source: Ambulatory Visit | Attending: Physician Assistant | Admitting: Physician Assistant

## 2011-10-06 DIAGNOSIS — N83202 Unspecified ovarian cyst, left side: Secondary | ICD-10-CM

## 2011-10-06 DIAGNOSIS — N9489 Other specified conditions associated with female genital organs and menstrual cycle: Secondary | ICD-10-CM | POA: Insufficient documentation

## 2012-10-03 IMAGING — US US OB TRANSVAGINAL
1 series · 13 of 28 positions shown · non-contrast
Comparison: none

[Series 1: us ob comp less 14 wks · 13 of 52 slices shown]
[im 2/52]
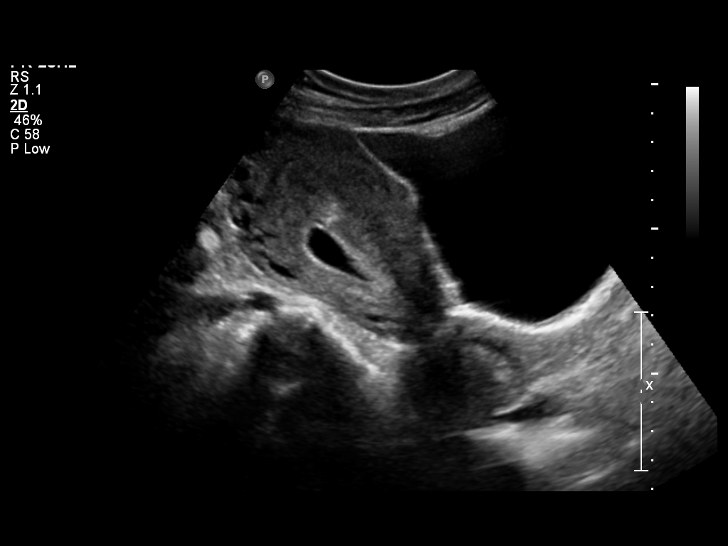
[im 6/52]
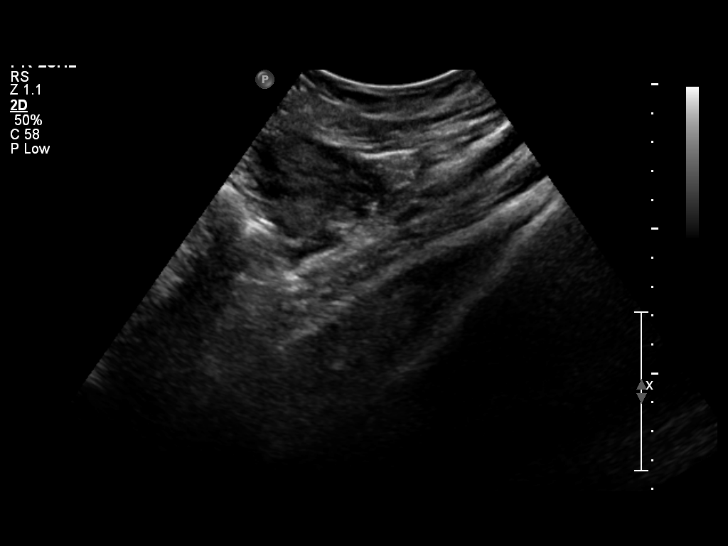
[im 10/52]
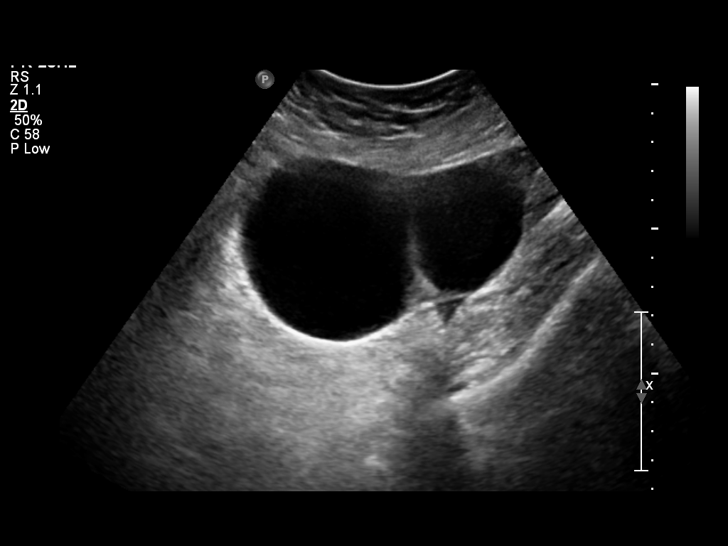
[im 14/52]
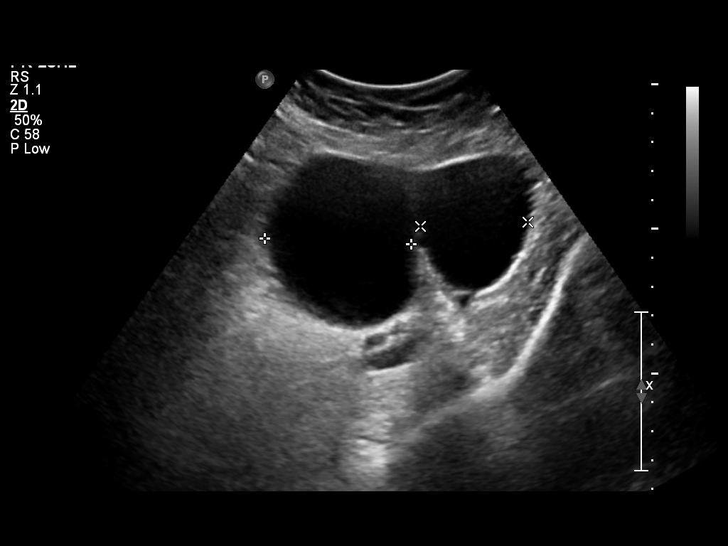
[im 18/52]
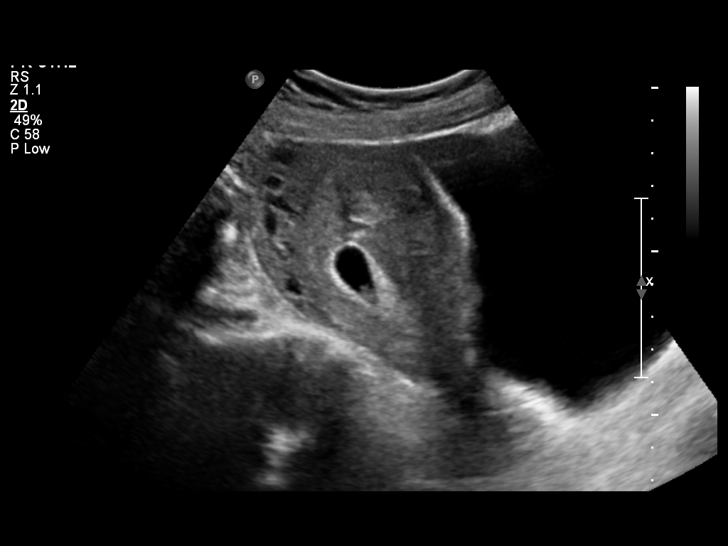
[im 21/52]
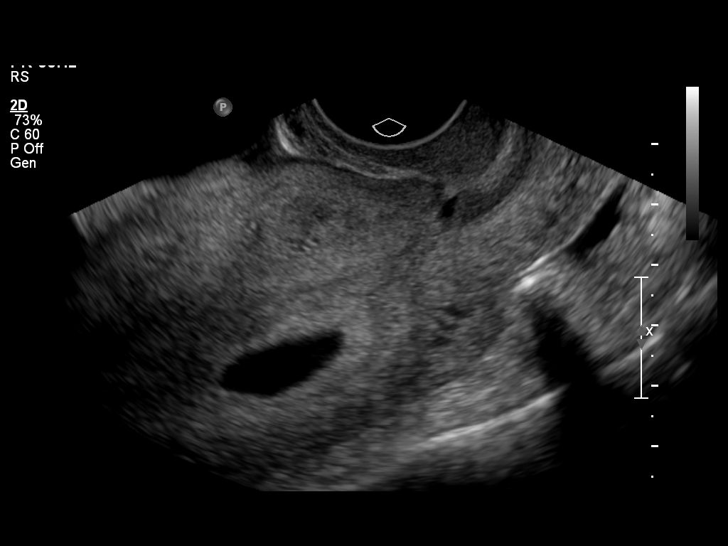
[im 27/52]
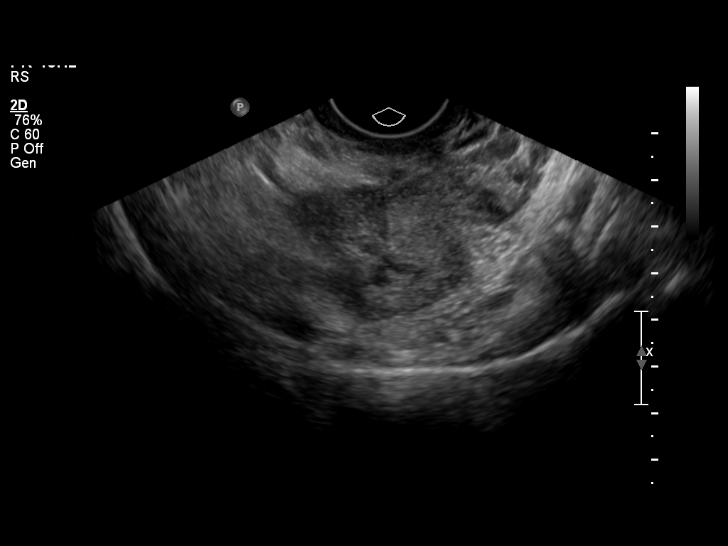
[im 31/52]
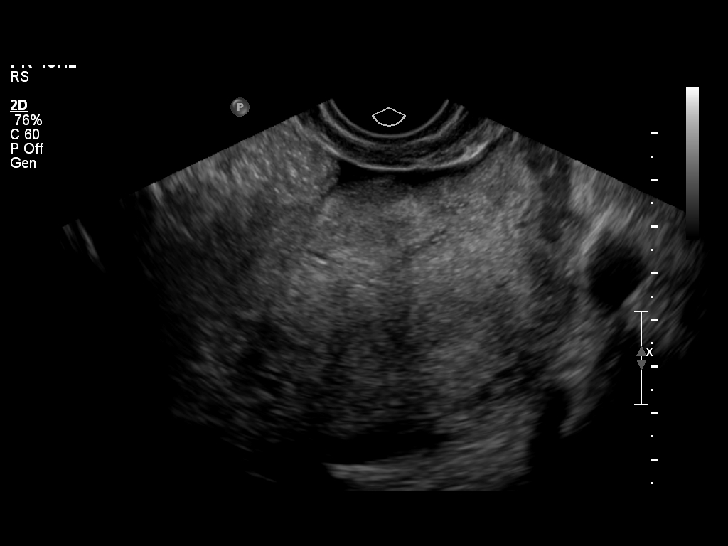
[im 35/52]
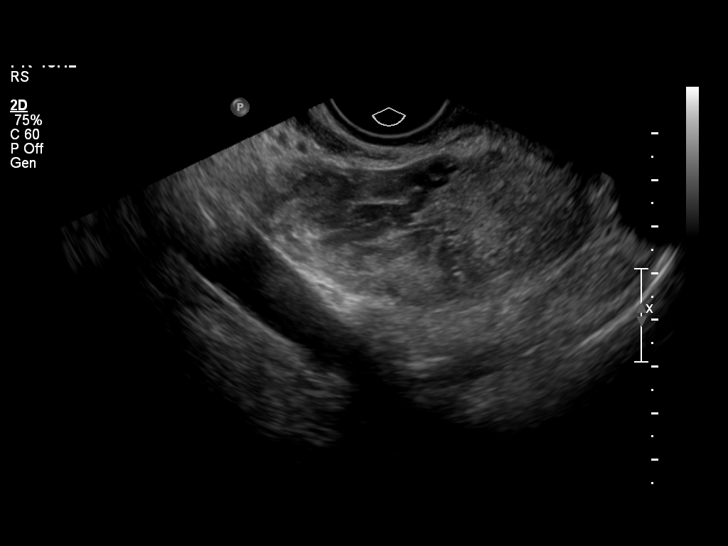
[im 38/52]
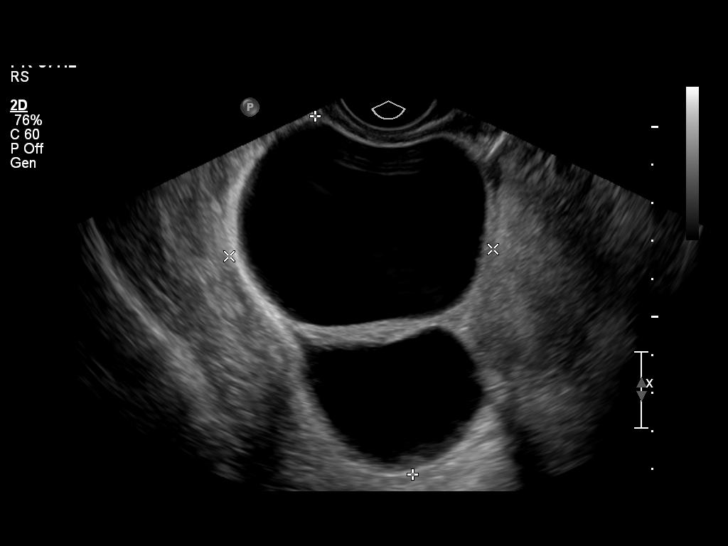
[im 42/52]
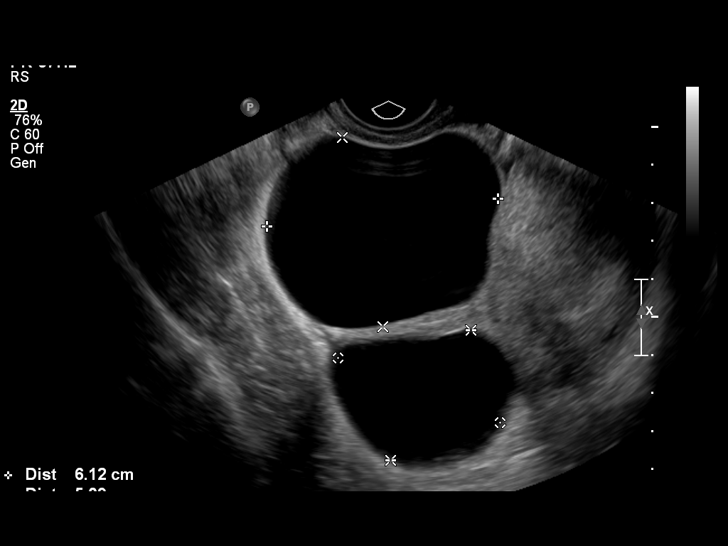
[im 46/52]
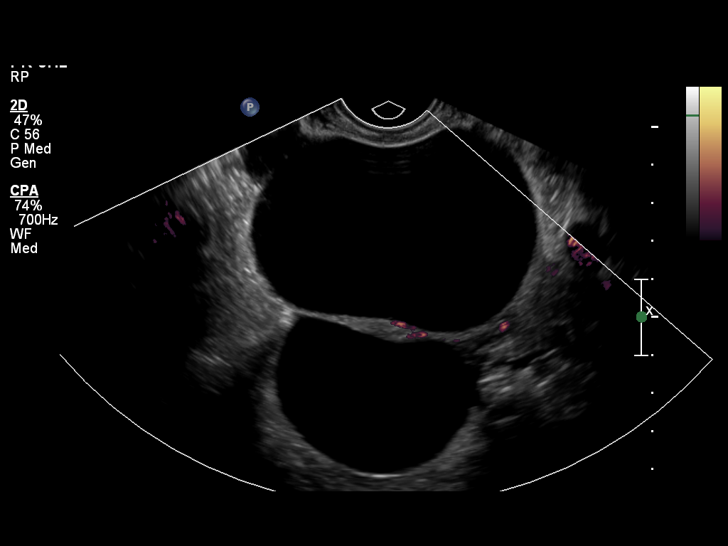
[im 50/52]
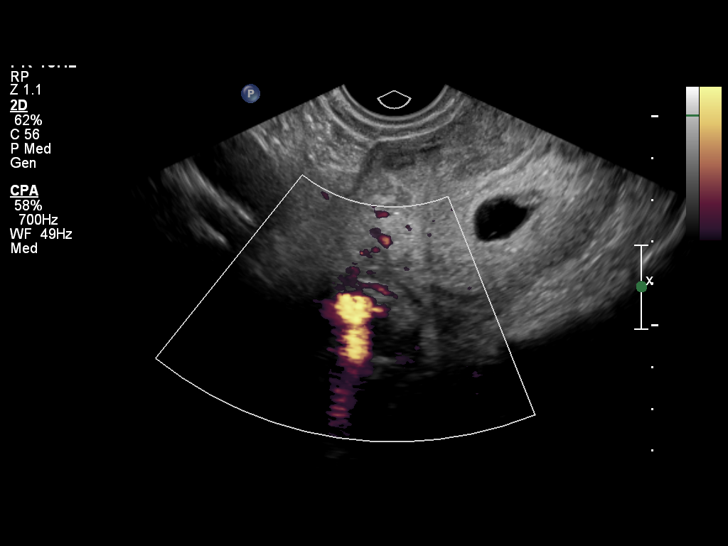

[13 of 28 positions shown; findings below may reference images not displayed]

OBSTETRICS REPORT
                      (Signed Final 07/28/2011 [DATE])

                 64_E
Procedures

 US OB COMP LESS 14 WKS                                76801.0
 US OB TRANSVAGINAL                                    76817.0
Indications

 Pain - Abdominal/Pelvic
Fetal Evaluation

 Gest. Sac:         Intrauterine
 Yolk Sac:          Visualized
 Fetal Pole:        Visualized
 Fetal Heart Rate:  120                          bpm
 Cardiac Activity:  Observed
Biometry

 GS:      17.7  mm     G. Age:  6w 6d                  EDD:    03/16/12
 CRL:      6.9  mm     G. Age:  6w 4d                  EDD:    03/18/12
Gestational Age

 LMP:           6w 6d         Date:  06/10/11                 EDD:   03/16/12
 Best:          6w 6d      Det. By:  LMP  (06/10/11)          EDD:   03/16/12
Cervix Uterus Adnexa

 Cervix:       Closed.
 Cul De Sac:   Trace amount of free fluid seen.
 Left Ovary:    Large septated cystic lesion vs 2 adjacent simple
                cysts measuring 8.7 x 6.7cm; no nodules
 Right Ovary:   Within normal limits.
 Adnexa:     No abnormality visualized.
Impression

 Single living IUP.  US EGA is concordant with LMP.
 Large indeterminate left ovarian cystic lesion measuring up to
 8.7cm.  Recommend f/u by US in 6 wks.
 questions or concerns.

## 2012-10-23 IMAGING — US US OB COMP LESS 14 WK
1 series · 13 of 28 positions shown · non-contrast
Comparison: none

[Series 1: us ob comp less 14 wks · 13 of 28 slices shown]
[im 2/28]
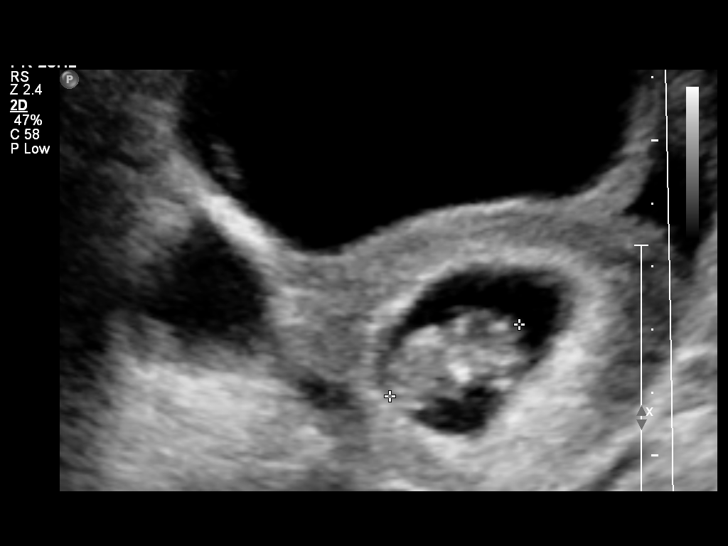
[im 4/28]
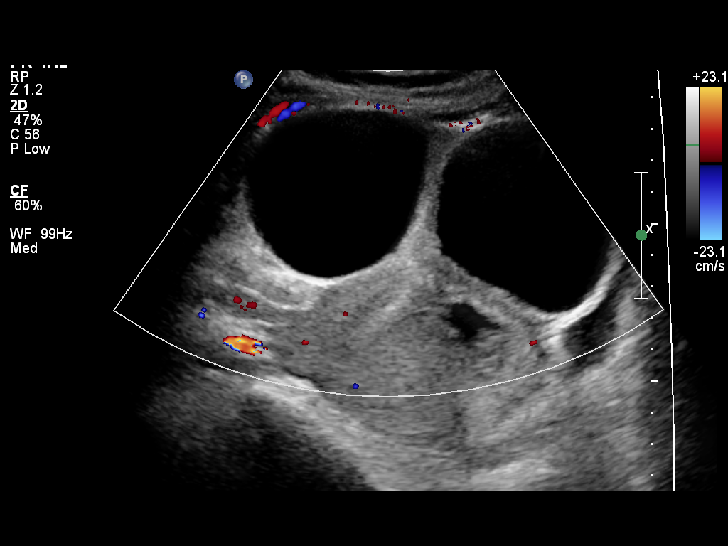
[im 6/28]
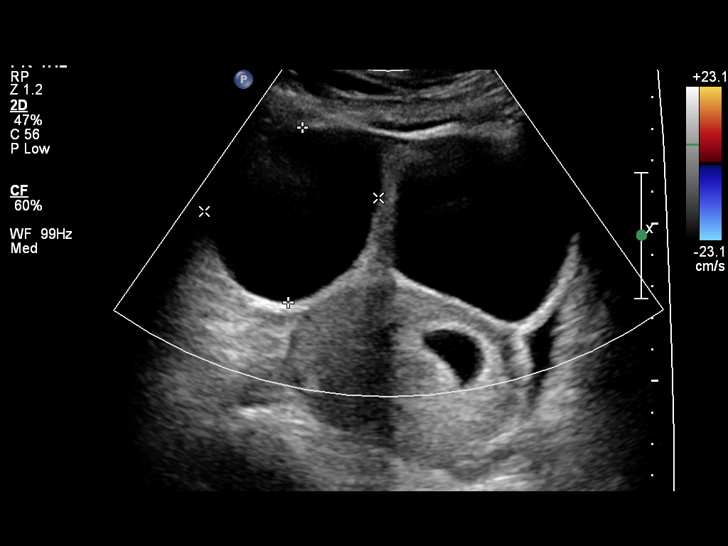
[im 8/28]
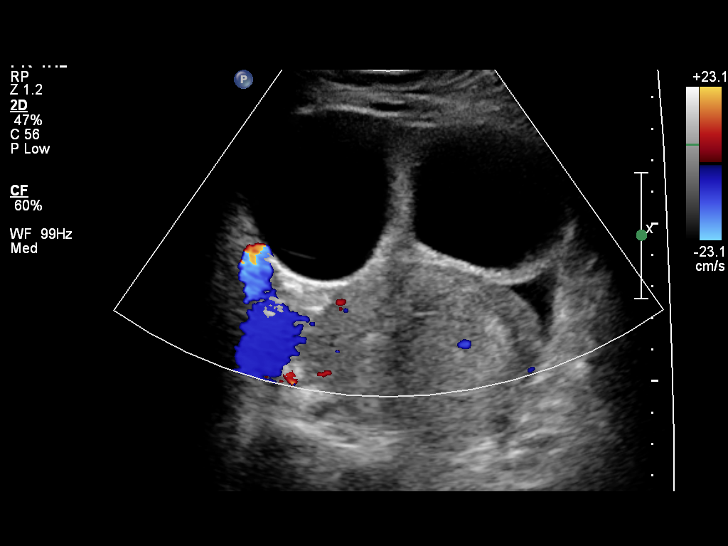
[im 10/28]
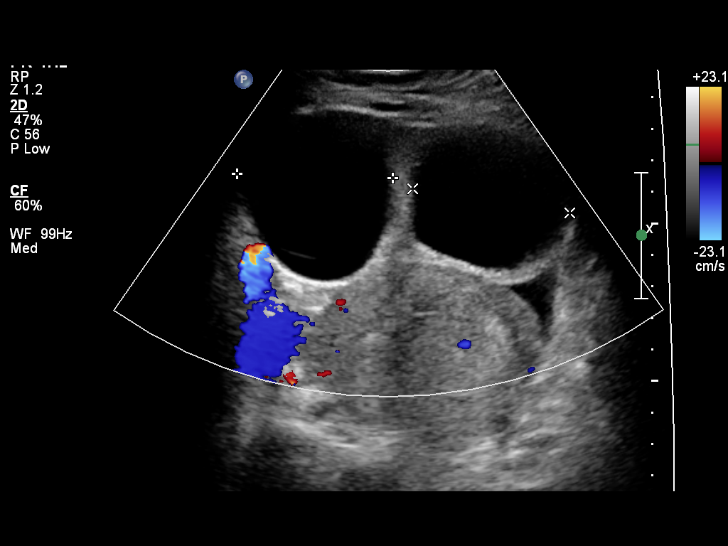
[im 12/28]
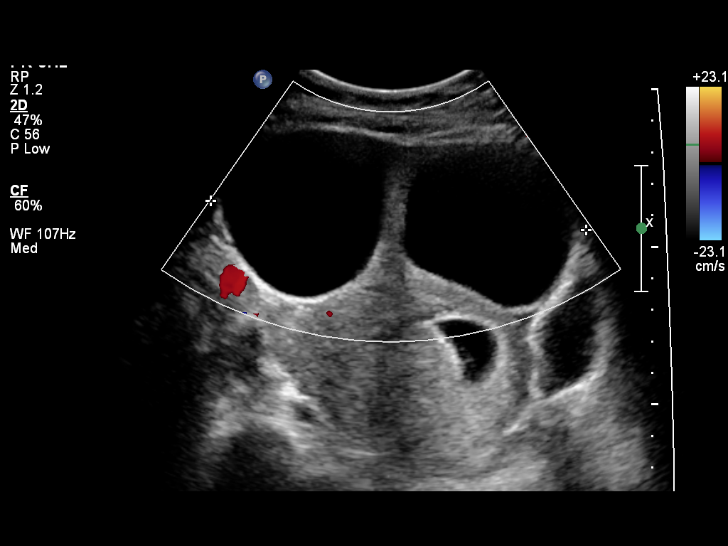
[im 15/28]
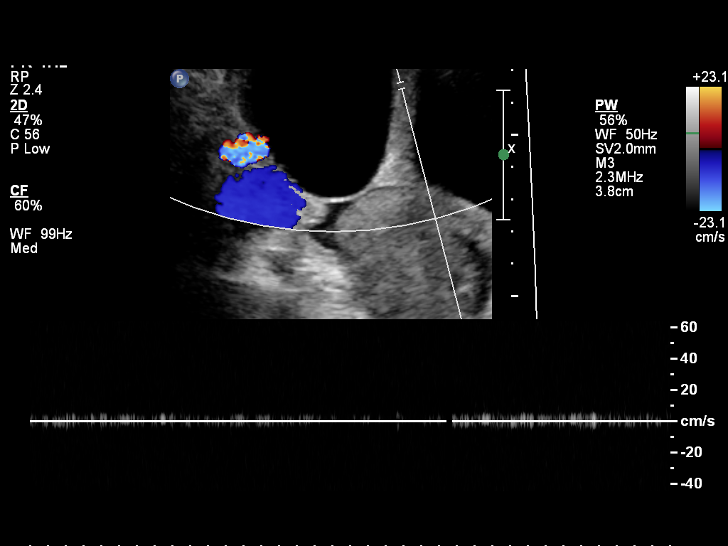
[im 17/28]
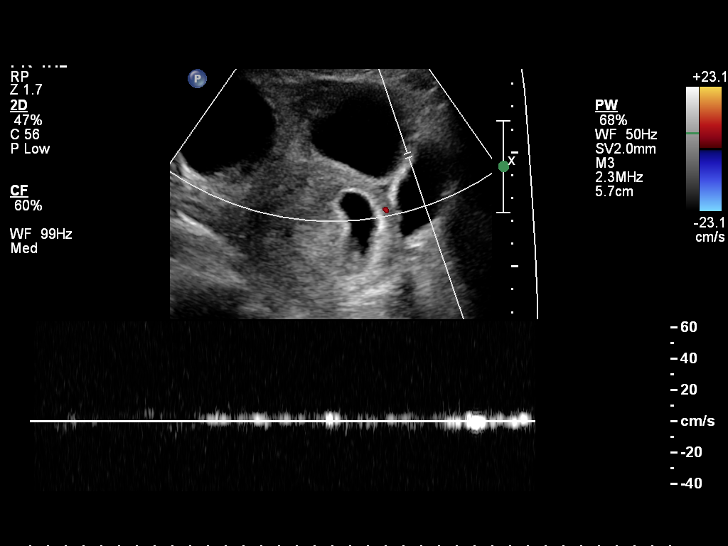
[im 19/28]
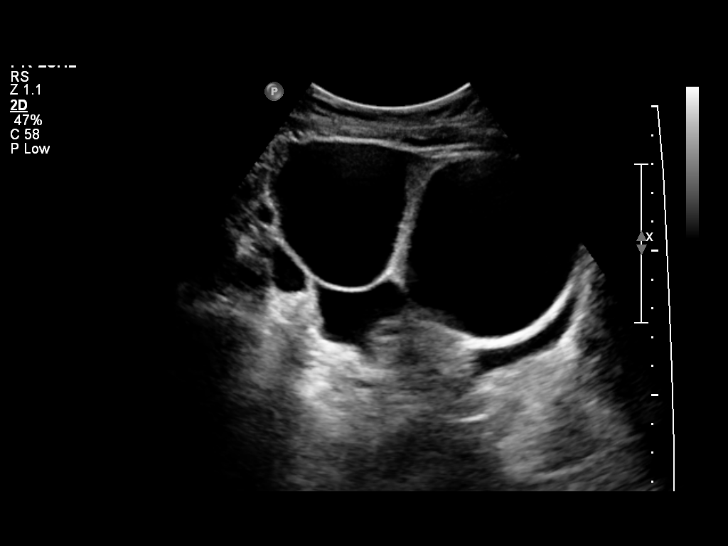
[im 21/28]
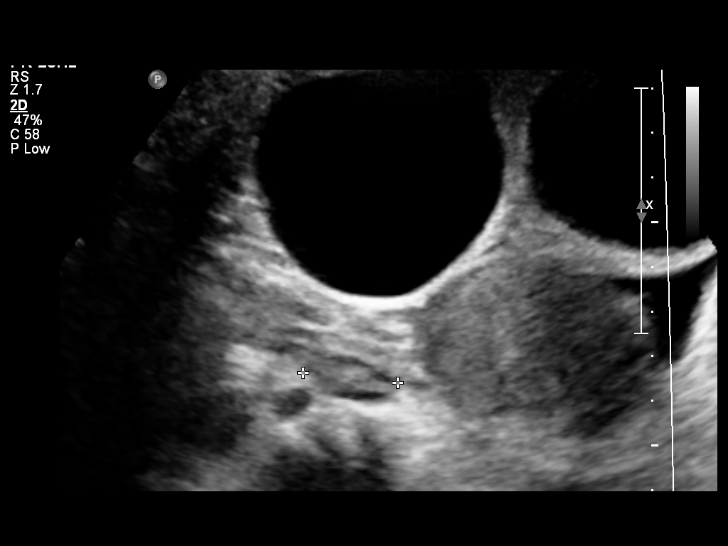
[im 23/28]
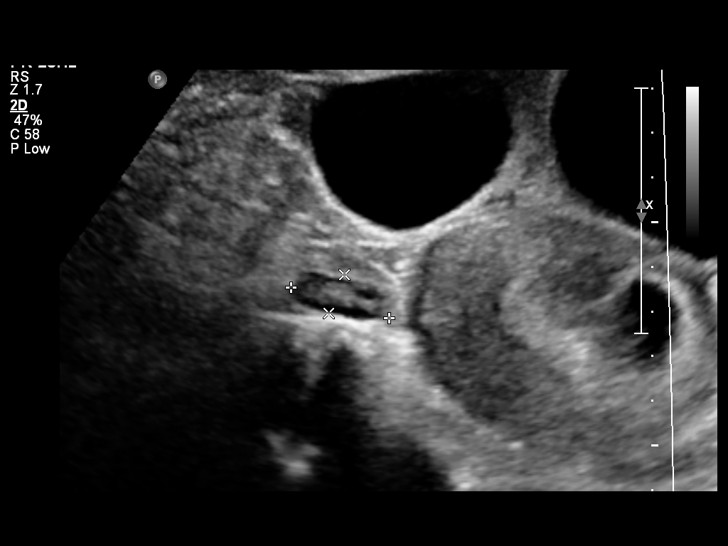
[im 25/28]
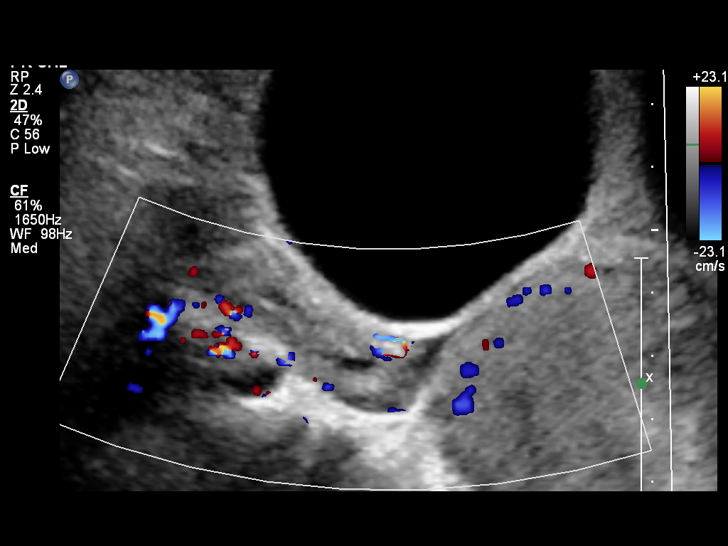
[im 27/28]
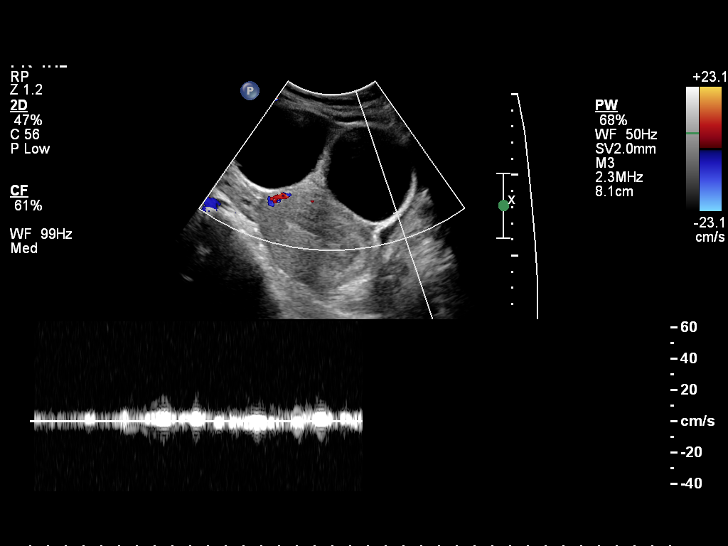

[13 of 28 positions shown; findings below may reference images not displayed]

OBSTETRICS REPORT
                      (Signed Final 08/17/2011 [DATE])

                 LEANDRE
Procedures

 US OB COMP LESS 14 WKS                                76801.0
 US Doppler of Pelvis/Ovaries                          93975.0
Indications

 Pain - Abdominal/Pelvic
Fetal Evaluation

 Preg. Location:    Intrauterine
 Gest. Sac:         Intrauterine
 Fetal Pole:        Visualized
 Fetal Heart Rate:  174                          bpm
 Cardiac Activity:  Observed

 Amniotic Fluid
 AFI FV:      Subjectively within normal limits
Biometry

 CRL:     23.4  mm     G. Age:  9w 0d                  EDD:    03/21/12
Gestational Age

 LMP:           9w 5d         Date:  06/10/11                 EDD:   03/16/12
 Best:          9w 5d      Det. By:  LMP  (06/10/11)          EDD:   03/16/12
Cervix Uterus Adnexa

 Cervix:       Closed.

 Left Ovary:    X3G1GXXcm containing either a septated cyst or 2
                separate 5.5cm simple cysts.
 Right Ovary:   Within normal limits.

 Adnexa:     Small amount of free fluid.
Impression

 Single living intrauterine embryo. The estimated gestational
 age is 9w 5d based on LMP  (06/10/11). Left adnexal cysts
 are 12 x 5 x 11 cm.  Peripheral blood flow is detected,
 excluding complete torsion. Cysts are slightly larger than
 previous exam. Small smount of free fluid.

 questions or concerns.

## 2012-10-28 IMAGING — US US OB TRANSVAGINAL
1 series · 14 of 28 positions shown · non-contrast
Comparison: 08/17/2011

CLINICAL DATA: Evaluate for retained products of conception

TRANSVAGINAL OBSTETRIC US
TECHNIQUE: Transvaginal ultrasound was performed for complete
evaluation of the gestation as well as the maternal uterus, adnexal
regions, and pelvic cul-de-sac.

[Series 1: us ob transvaginal · 42 acquisitions, 14 frames shown]
[im 2/42]
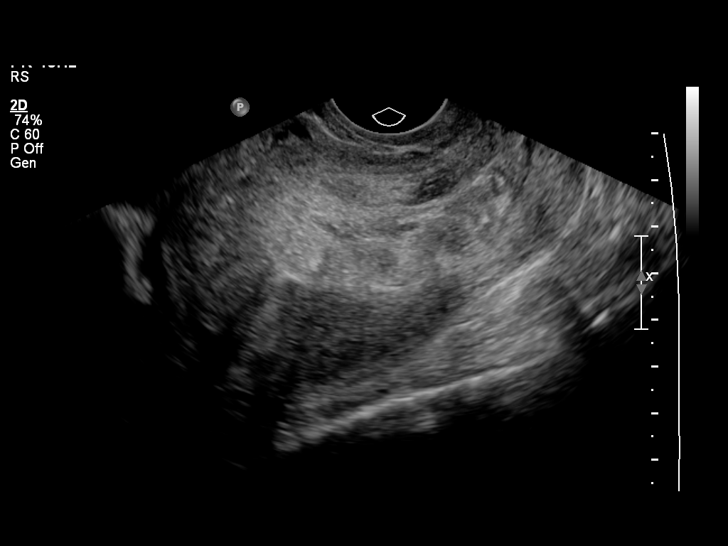
[im 5/42]
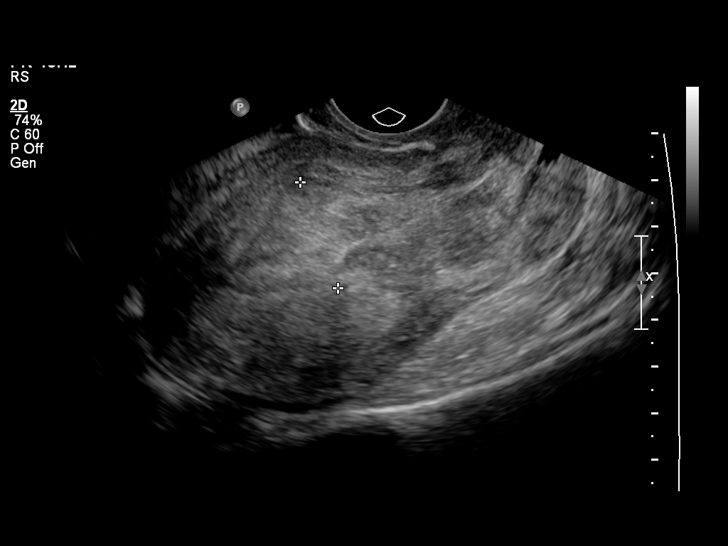
[im 8/42]
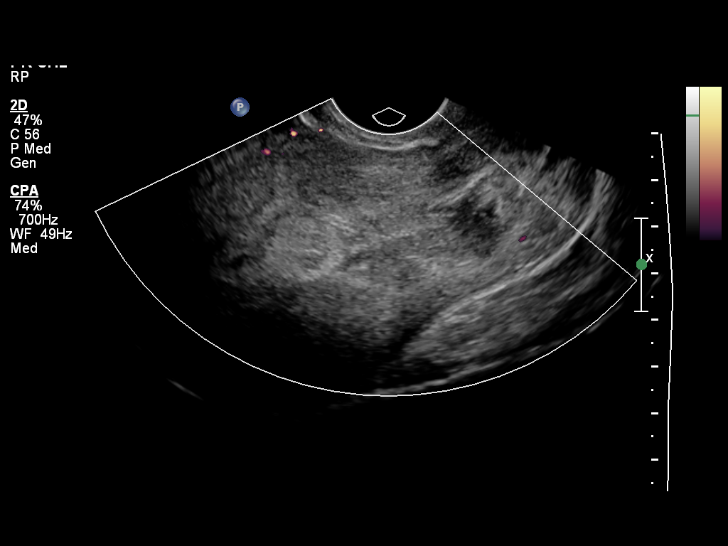
[im 11/42]
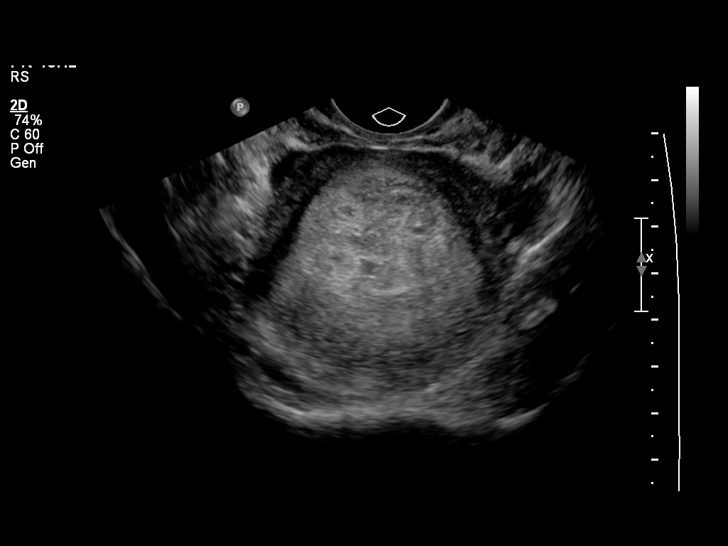
[im 14/42]
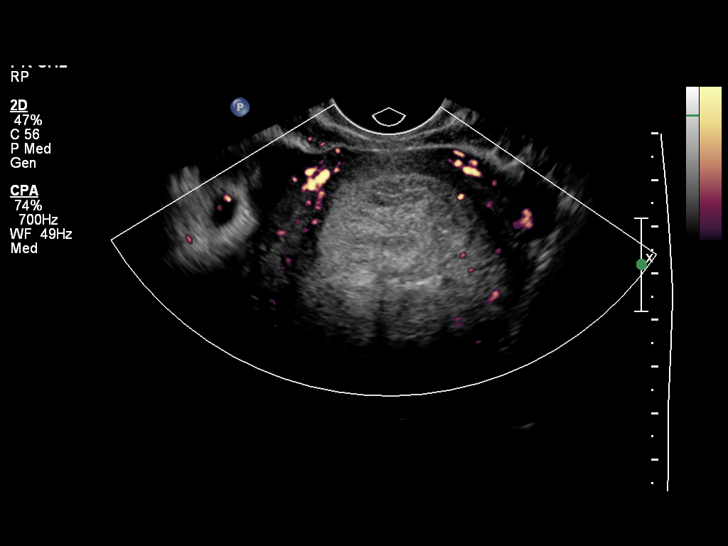
[im 17/42]
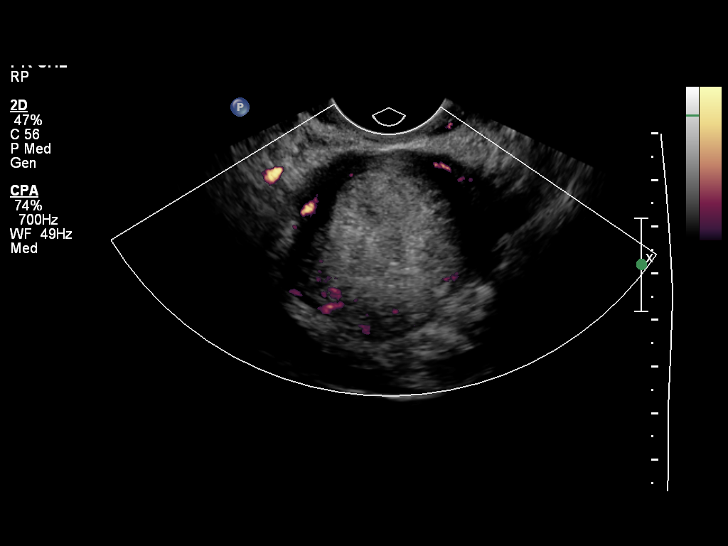
[im 20/42]
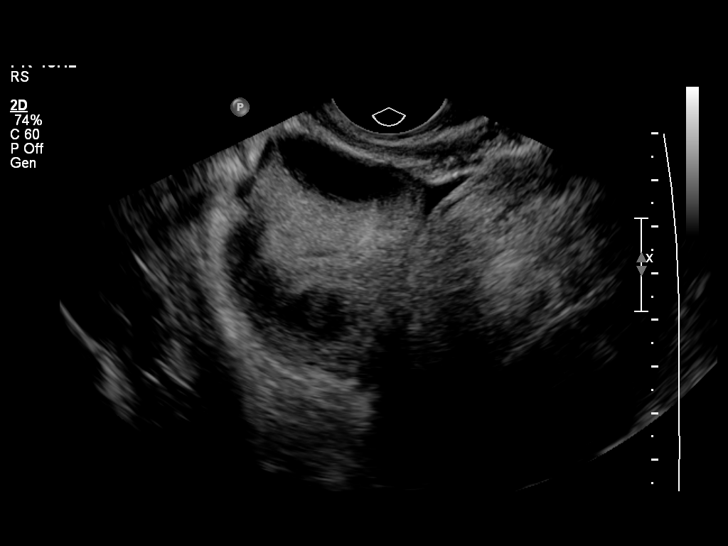
[im 23/42]
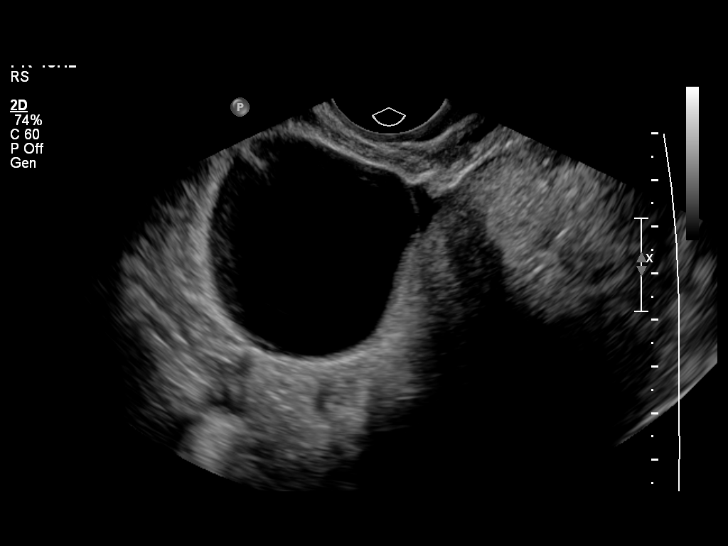
[im 26/42]
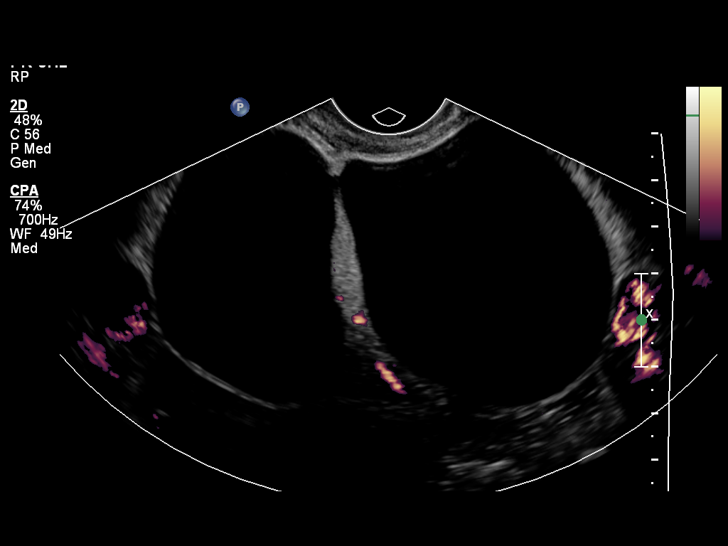
[im 29/42]
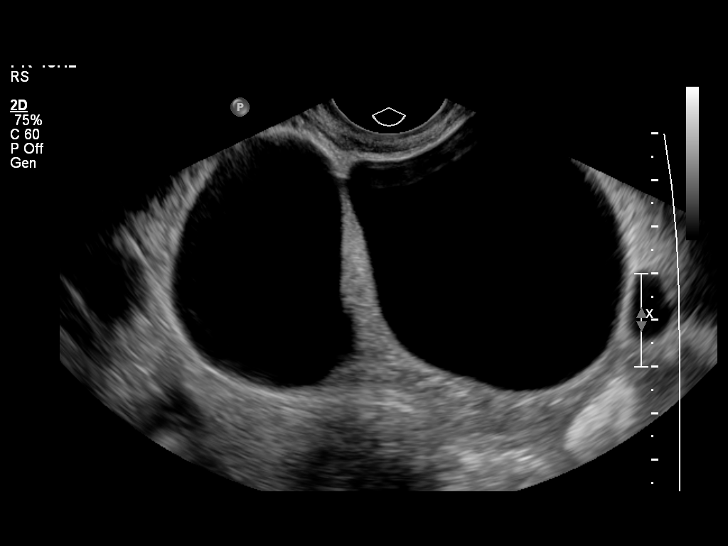
[im 32/42]
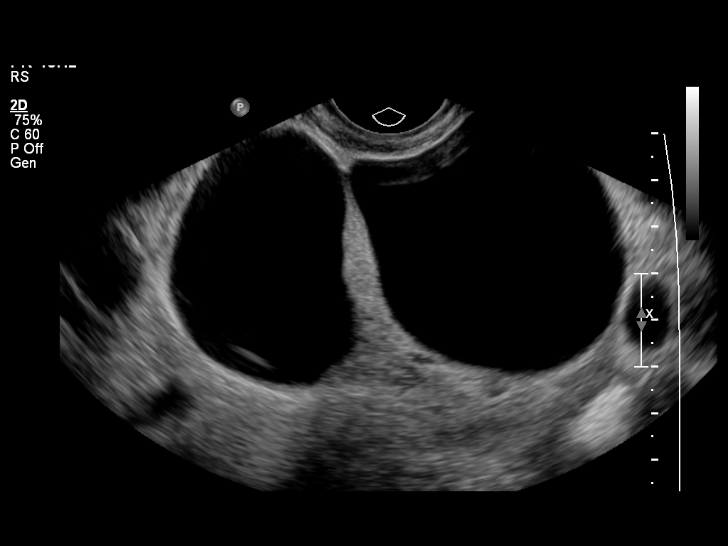
[im 35/42]
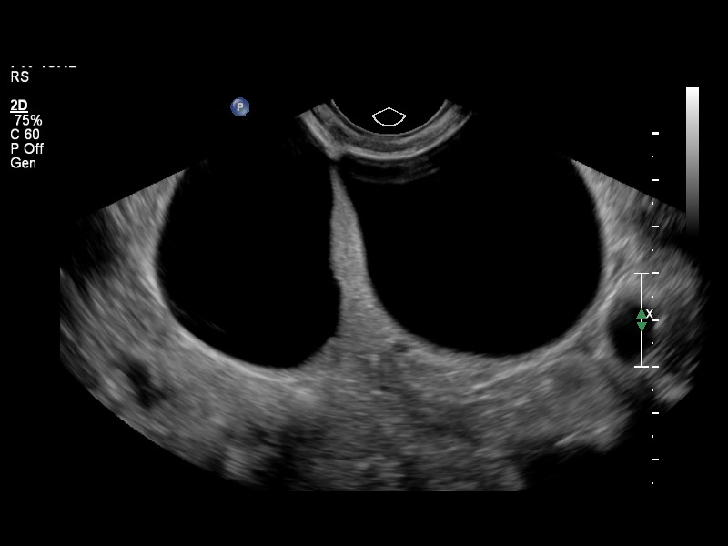
[im 38/42]
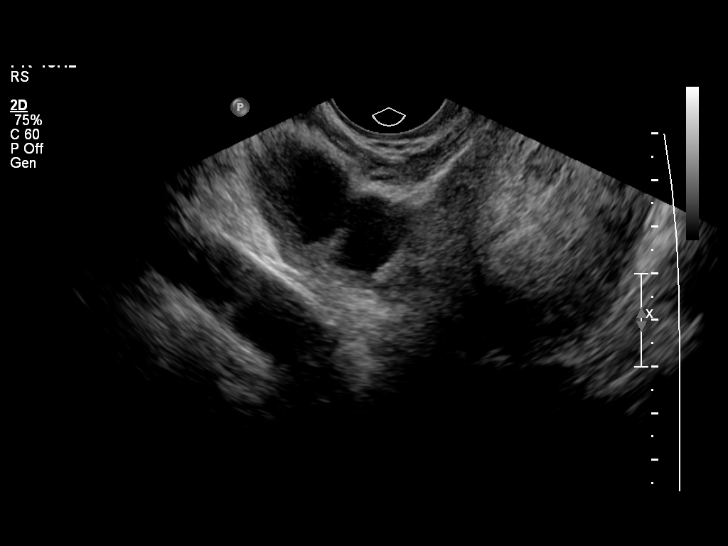
[im 42/42]
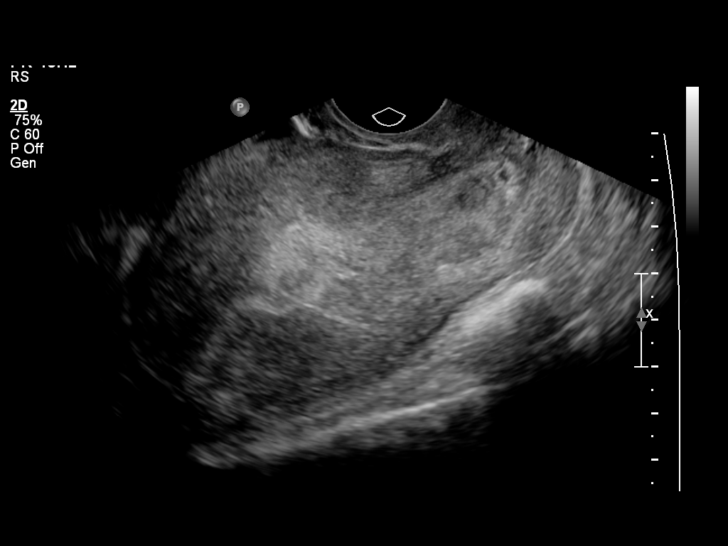

[14 of 28 positions shown; findings below may reference images not displayed]

Intrauterine gestational sac: Not visualized.

Maternal uterus/adnexae:
Uterus measures 5.8 x 6.2 x 9.8 cm.

Endometrium is heterogeneous, measuring 2.4 cm in thickness,
without evidence of focal Doppler flow.

Right ovary is not discretely visualized.

Left ovary is notable for two simple appearing cysts measuring
x 3.7 x 4.3 cm and 6.6 x 4.9 x 5.4 cm.
IMPRESSION: No intrauterine gestational sac visualized.

Heterogeneous endometrium, measuring 2.4 cm in thickness, without
focal Doppler flow.  This appearance is suspicious for nonvascular
retained products of conception.

## 2012-12-13 NOTE — L&D Delivery Note (Signed)
Dr. Jolayne Panther was present for delivery and I agree with note above. [facility]

## 2012-12-13 NOTE — L&D Delivery Note (Signed)
Delivery Note  Patient progressed to complete dilation and was allowed to labor down.  At 5:32 AM a viable and healthy female was delivered via VBAC, Spontaneous (Presentation: Left Occiput Anterior).  APGAR: 8, 9; weight pending .   Placenta status: Intact, Spontaneous.  Cord: 3 vessels with the following complications: None.  Cord pH: n/a  Anesthesia: Epidural  Episiotomy: None Lacerations: 2nd degree;Perineal Suture Repair: 3.0 vicryl Est. Blood Loss (mL): 300  Mom to postpartum.  Baby to nursery-stable.  Delivery of the infant was supervised by Dr. Jolayne Panther. Delivery of the placenta and laceration repair was supervised by Sid Falcon CNM.  Sheena Hernandez 08/12/2013, 6:01 AM

## 2012-12-18 ENCOUNTER — Ambulatory Visit: Payer: Medicaid Other

## 2012-12-18 DIAGNOSIS — N926 Irregular menstruation, unspecified: Secondary | ICD-10-CM

## 2012-12-18 LAB — POCT URINALYSIS DIP (DEVICE)
Glucose, UA: NEGATIVE mg/dL
Ketones, ur: 15 mg/dL — AB
Leukocytes, UA: NEGATIVE
Nitrite: NEGATIVE

## 2012-12-18 LAB — POCT PREGNANCY, URINE: Preg Test, Ur: POSITIVE — AB

## 2012-12-22 ENCOUNTER — Encounter (HOSPITAL_COMMUNITY): Payer: Self-pay | Admitting: *Deleted

## 2012-12-22 ENCOUNTER — Inpatient Hospital Stay (HOSPITAL_COMMUNITY)
Admission: AD | Admit: 2012-12-22 | Discharge: 2012-12-23 | Disposition: A | Discharge status: Home / Self Care | Payer: PRIVATE HEALTH INSURANCE | Source: Ambulatory Visit | Attending: Obstetrics & Gynecology | Admitting: Obstetrics & Gynecology

## 2012-12-22 DIAGNOSIS — O210 Mild hyperemesis gravidarum: Secondary | ICD-10-CM | POA: Insufficient documentation

## 2012-12-22 DIAGNOSIS — O26899 Other specified pregnancy related conditions, unspecified trimester: Secondary | ICD-10-CM

## 2012-12-22 DIAGNOSIS — O219 Vomiting of pregnancy, unspecified: Secondary | ICD-10-CM

## 2012-12-22 DIAGNOSIS — R109 Unspecified abdominal pain: Secondary | ICD-10-CM | POA: Insufficient documentation

## 2012-12-22 HISTORY — DX: Maternal care for other abnormalities of pelvic organs, unspecified trimester: O34.80

## 2012-12-22 HISTORY — DX: Unspecified ovarian cyst, unspecified side: N83.209

## 2012-12-22 LAB — URINALYSIS, ROUTINE W REFLEX MICROSCOPIC
Nitrite: NEGATIVE
Protein, ur: NEGATIVE mg/dL
Specific Gravity, Urine: 1.025 (ref 1.005–1.030)
Urobilinogen, UA: 1 mg/dL (ref 0.0–1.0)

## 2012-12-22 NOTE — MAU Note (Signed)
Vomiting for 2 wks. No diarrhea. Have some stomach pain, mostly at night.

## 2012-12-23 ENCOUNTER — Encounter (HOSPITAL_COMMUNITY): Payer: Self-pay | Admitting: Advanced Practice Midwife

## 2012-12-23 DIAGNOSIS — O219 Vomiting of pregnancy, unspecified: Secondary | ICD-10-CM

## 2012-12-23 MED ORDER — CONCEPT OB 130-92.4-1 MG PO CAPS: 1.0000 | ORAL_CAPSULE | Freq: Every day | ORAL | Status: DC

## 2012-12-23 MED ORDER — ONDANSETRON HCL 8 MG PO TABS: 8.0000 mg | ORAL_TABLET | Freq: Three times a day (TID) | ORAL | Status: DC | PRN

## 2012-12-23 MED ORDER — ONDANSETRON 8 MG PO TBDP
8.0000 mg | ORAL_TABLET | Freq: Once | ORAL | Status: AC
  Administered 2012-12-23: 8 mg via ORAL
  Filled 2012-12-23: qty 1

## 2012-12-23 NOTE — MAU Provider Note (Signed)
Chief Complaint: Emesis During Pregnancy   First Provider Initiated Contact with Patient 12/23/12 0046     SUBJECTIVE HPI: Sheena Hernandez is a 32 y.o. G4P2002 at [redacted]w[redacted]d by LMP who presents with nausea and vomiting x two weeks and mild generalized low abd pain. Vomits 5 x/day. Has not tried any meds. Able to drink small amounts. No fever, diarrhea, urinary complaints, VB, vaginal discharge.    Past Medical History  Diagnosis Date  . No pertinent past medical history   . Ovarian cyst in pregnancy 08/2011    Resolved    OB History    Grav Para Term Preterm Abortions TAB SAB Ect Mult Living   4 2 2  1  1   2      # Outc Date GA Lbr Len/2nd Wgt Sex Del Anes PTL Lv   1 TRM 6/08     CS Gen  Yes   2 SAB 9/12           3 TRM            4 CUR              Past Surgical History  Procedure Date  . Cesarean section    History   Social History  . Marital Status: Married    Spouse Name: N/A    Number of Children: N/A  . Years of Education: N/A   Occupational History  . Not on file.   Social History Main Topics  . Smoking status: Never Smoker   . Smokeless tobacco: Not on file  . Alcohol Use: No  . Drug Use: No  . Sexually Active: Yes   Other Topics Concern  . Not on file   Social History Narrative  . No narrative on file   No current facility-administered medications on file prior to encounter.   No current outpatient prescriptions on file prior to encounter.   No Known Allergies  ROS: Pertinent items in HPI  OBJECTIVE Blood pressure 113/72, pulse 74, temperature 98.2 F (36.8 C), temperature source Oral, resp. rate 20, height 5\' 7"  (1.702 m), weight 77.021 kg (169 lb 12.8 oz), last menstrual period 11/08/2012. GENERAL: Well-developed, well-nourished female in no acute distress.  HEENT: Normocephalic. Mucus membranes moist HEART: normal rate RESP: normal effort ABDOMEN: Soft, non-tender EXTREMITIES: Nontender, no edema NEURO: Alert and oriented SPECULUM EXAM:  deferred  LAB RESULTS Results for orders placed during the hospital encounter of 12/22/12 (from the past 24 hour(s))  URINALYSIS, ROUTINE W REFLEX MICROSCOPIC     Status: Abnormal   Collection Time   12/22/12 11:41 PM      Component Value Range   Color, Urine YELLOW  YELLOW   APPearance CLEAR  CLEAR   Specific Gravity, Urine 1.025  1.005 - 1.030   pH 6.0  5.0 - 8.0   Glucose, UA NEGATIVE  NEGATIVE mg/dL   Hgb urine dipstick NEGATIVE  NEGATIVE   Bilirubin Urine NEGATIVE  NEGATIVE   Ketones, ur 40 (*) NEGATIVE mg/dL   Protein, ur NEGATIVE  NEGATIVE mg/dL   Urobilinogen, UA 1.0  0.0 - 1.0 mg/dL   Nitrite NEGATIVE  NEGATIVE   Leukocytes, UA NEGATIVE  NEGATIVE    IMAGING Informal BS US shows SIUP, CRL 6.5 weeks, + YS, +cardiac activity.  MAU COURSE Tolerating PO's after Zofran. Denies Nausea/Vomiting.  ASSESSMENT 1. Nausea/vomiting in pregnancy   2. Abdominal pain in pregnancy, antepartum     PLAN Discharge home     Follow-up Information  Follow up with [facility]. (as scheduled)    Contact information:   [address] [place] [address] [phone]      Follow up with [facility] [place] [facility]. (As needed if symptoms worsen)    Contact information:   [address] [id number] mc [place] [address] [phone]          Medication List     As of 12/23/2012  2:13 AM    STOP taking these medications         prenatal vitamin w/FE, FA 27-1 MG Tabs      TAKE these medications         CONCEPT OB 130-92.4-1 MG Caps   Take 1 tablet by mouth daily.      ondansetron 8 MG tablet   Commonly known as: ZOFRAN   Take 1 tablet (8 mg total) by mouth every 8 (eight) hours as needed for nausea.         [place], CNM 12/23/2012  2:13 AM

## 2013-01-17 ENCOUNTER — Encounter: Payer: Self-pay | Admitting: Family Medicine

## 2013-01-24 ENCOUNTER — Ambulatory Visit (INDEPENDENT_AMBULATORY_CARE_PROVIDER_SITE_OTHER): Payer: Medicaid Other | Admitting: Advanced Practice Midwife

## 2013-01-24 ENCOUNTER — Other Ambulatory Visit (HOSPITAL_COMMUNITY)
Admission: RE | Admit: 2013-01-24 | Discharge: 2013-01-24 | Disposition: A | Discharge status: Home / Self Care | Payer: Medicaid Other | Source: Ambulatory Visit | Attending: Advanced Practice Midwife | Admitting: Advanced Practice Midwife

## 2013-01-24 ENCOUNTER — Encounter: Payer: Self-pay | Admitting: Advanced Practice Midwife

## 2013-01-24 VITALS — BP 127/81 | Temp 98.1°F | Wt 162.0 lb

## 2013-01-24 DIAGNOSIS — Z113 Encounter for screening for infections with a predominantly sexual mode of transmission: Secondary | ICD-10-CM | POA: Insufficient documentation

## 2013-01-24 DIAGNOSIS — Z3490 Encounter for supervision of normal pregnancy, unspecified, unspecified trimester: Secondary | ICD-10-CM

## 2013-01-24 DIAGNOSIS — Z3480 Encounter for supervision of other normal pregnancy, unspecified trimester: Secondary | ICD-10-CM

## 2013-01-24 DIAGNOSIS — Z23 Encounter for immunization: Secondary | ICD-10-CM

## 2013-01-24 MED ORDER — INFLUENZA VIRUS VACC SPLIT PF IM SUSP
0.5000 mL | Freq: Once | INTRAMUSCULAR | Status: AC
  Administered 2013-01-24: 0.5 mL via INTRAMUSCULAR

## 2013-01-24 NOTE — Progress Notes (Signed)
Pulse 115 C/o pain on left side of abdomen.

## 2013-01-24 NOTE — Progress Notes (Signed)
S: some nausea and constipation, otherwise well. Desires TOLAC and First screen O: abd soft, NT Ext genitalia: normal Vagina: pink, no lesion Cervix: pink, parous, smooth Uterus: AGA A/P: [redacted]w[redacted]d  Reviewed comfort measures for nausea and constipation Routine care GC, new ob labs and early 1 hour GTT (2/2 hx of macrosomia) today Plans to have FIRST screen

## 2013-01-26 LAB — OBSTETRIC PANEL
Antibody Screen: NEGATIVE
Basophils Relative: 0 % (ref 0–1)
HCT: 35.9 % — ABNORMAL LOW (ref 36.0–46.0)
Hemoglobin: 12 g/dL (ref 12.0–15.0)
MCH: 26.1 pg (ref 26.0–34.0)
MCHC: 33.4 g/dL (ref 30.0–36.0)
Monocytes Absolute: 0.3 10*3/uL (ref 0.1–1.0)
Monocytes Relative: 6 % (ref 3–12)
Neutro Abs: 3.1 10*3/uL (ref 1.7–7.7)
Rh Type: POSITIVE
Rubella: 5.91 Index — ABNORMAL HIGH (ref <0.90)

## 2013-01-26 LAB — CULTURE, OB URINE

## 2013-01-30 ENCOUNTER — Encounter: Payer: Self-pay | Admitting: *Deleted

## 2013-02-04 ENCOUNTER — Encounter (HOSPITAL_COMMUNITY): Payer: Self-pay | Admitting: Family

## 2013-02-04 ENCOUNTER — Inpatient Hospital Stay (HOSPITAL_COMMUNITY)
Admission: AD | Admit: 2013-02-04 | Discharge: 2013-02-04 | Disposition: A | Discharge status: Home / Self Care | Payer: Medicaid Other | Source: Ambulatory Visit | Attending: Obstetrics and Gynecology | Admitting: Obstetrics and Gynecology

## 2013-02-04 DIAGNOSIS — O210 Mild hyperemesis gravidarum: Secondary | ICD-10-CM | POA: Insufficient documentation

## 2013-02-04 DIAGNOSIS — O99891 Other specified diseases and conditions complicating pregnancy: Secondary | ICD-10-CM | POA: Insufficient documentation

## 2013-02-04 DIAGNOSIS — R7302 Impaired glucose tolerance (oral): Secondary | ICD-10-CM

## 2013-02-04 DIAGNOSIS — R109 Unspecified abdominal pain: Secondary | ICD-10-CM | POA: Insufficient documentation

## 2013-02-04 DIAGNOSIS — R141 Gas pain: Secondary | ICD-10-CM | POA: Insufficient documentation

## 2013-02-04 DIAGNOSIS — O219 Vomiting of pregnancy, unspecified: Secondary | ICD-10-CM

## 2013-02-04 DIAGNOSIS — R142 Eructation: Secondary | ICD-10-CM | POA: Insufficient documentation

## 2013-02-04 DIAGNOSIS — R143 Flatulence: Secondary | ICD-10-CM | POA: Insufficient documentation

## 2013-02-04 LAB — URINALYSIS, ROUTINE W REFLEX MICROSCOPIC
Bilirubin Urine: NEGATIVE
Hgb urine dipstick: NEGATIVE
Specific Gravity, Urine: <1.005 — ABNORMAL LOW (ref 1.005–1.030)
pH: 6 (ref 5.0–8.0)

## 2013-02-04 MED ORDER — SIMETHICONE 80 MG PO CHEW: 80.0000 mg | CHEWABLE_TABLET | Freq: Four times a day (QID) | ORAL | Status: DC | PRN

## 2013-02-04 NOTE — MAU Note (Signed)
Patient presents to MAU with c/o RLQ abdominal pain x 1 month; reports an ache that has persisted and is relieved when she passes gas.  No vaginal bleeding or LOF.

## 2013-02-04 NOTE — MAU Provider Note (Signed)
Attestation of Attending Supervision of Advanced Practitioner: Evaluation and management procedures were performed by the PA/NP/CNM/OB Fellow under my supervision/collaboration. Chart reviewed and agree with management and plan.  Jenissa Tyrell V 02/04/2013 10:51 PM   

## 2013-02-04 NOTE — MAU Provider Note (Signed)
Chief Complaint: Abdominal Pain   First Provider Initiated Contact with Patient 02/04/13 1727     SUBJECTIVE HPI: Sheena Hernandez is a 32 y.o. [id number] at [redacted]w[redacted]d by LMP who presents with 1 month hx of gas pains and flatulence, pains relieved by eructation. Last BM yesterday, normal consistency and not constipated since early in pregnancy. Not a problem outside of pregnancy and before taking PNVs. Has N/V but has not vomited x 2 days. Had NOB and pelvic last week. No vaginal irritation. No UTI sx. No sx of sytemic illness.  PNC at [facility]. Early glucola (hx LGA) 143.  Past Medical History  Diagnosis Date  . No pertinent past medical history   . Ovarian cyst in pregnancy 08/2011    Resolved    OB History   Grav Para Term Preterm Abortions TAB SAB Ect Mult Living   4 2 2  1  1   2      # Outc Date GA Lbr Len/2nd Wgt Sex Del Anes PTL Lv   1 TRM 3/99     SVD      2 TRM 6/08     CS Gen  Yes   Comments: LGA- not sure of weight   3 SAB 9/12           4 CUR              Past Surgical History  Procedure Laterality Date  . Cesarean section    . Laparoscopy  08/18/2011    Procedure: LAPAROSCOPY OPERATIVE;  Surgeon: Reva Bores, MD;  Location: [facility];  Service: Gynecology;  Laterality: N/A;   History   Social History  . Marital Status: Married    Spouse Name: N/A    Number of Children: N/A  . Years of Education: N/A   Occupational History  . Not on file.   Social History Main Topics  . Smoking status: Never Smoker   . Smokeless tobacco: Never Used  . Alcohol Use: No  . Drug Use: No  . Sexually Active: Not Currently   Other Topics Concern  . Not on file   Social History Narrative  . No narrative on file   No current facility-administered medications on file prior to encounter.   Current Outpatient Prescriptions on File Prior to Encounter  Medication Sig Dispense Refill  . ondansetron (ZOFRAN) 8 MG tablet Take 1 tablet (8 mg total) by mouth every 8 (eight) hours as needed for  nausea.  20 tablet  1  . Prenat w/o A Vit-FeFum-FePo-FA (CONCEPT OB) 130-92.4-1 MG CAPS Take 1 tablet by mouth daily.  30 capsule  12   No Known Allergies  ROS: Pertinent items in HPI  OBJECTIVE Blood pressure 98/56, pulse 90, temperature 97.8 F (36.6 C), resp. rate 18, height 5\' 7"  (1.702 m), weight 73.029 kg (161 lb), last menstrual period 11/08/2012, unknown if currently breastfeeding. GENERAL: Well-developed, well-nourished female in no acute distress.  HEENT: Normocephalic HEART: normal rate RESP: normal effort ABDOMEN: Soft, non-tender, no bloating. BS nl. DT FHR 164 EXTREMITIES: Nontender, no edema NEURO: Alert and oriented  LAB RESULTS Results for orders placed during the hospital encounter of 02/04/13 (from the past 24 hour(s))  URINALYSIS, ROUTINE W REFLEX MICROSCOPIC     Status: Abnormal   Collection Time    02/04/13  5:20 PM      Result Value Range   Color, Urine YELLOW  YELLOW   APPearance CLEAR  CLEAR   Specific Gravity, Urine <1.005 (*)  1.005 - 1.030   pH 6.0  5.0 - 8.0   Glucose, UA NEGATIVE  NEGATIVE mg/dL   Hgb urine dipstick NEGATIVE  NEGATIVE   Bilirubin Urine NEGATIVE  NEGATIVE   Ketones, ur NEGATIVE  NEGATIVE mg/dL   Protein, ur NEGATIVE  NEGATIVE mg/dL   Urobilinogen, UA 0.2  0.0 - 1.0 mg/dL   Nitrite NEGATIVE  NEGATIVE   Leukocytes, UA NEGATIVE  NEGATIVE   I ASSESSMENT 1. Flatulence, eructation and gas pain   2. Nausea/vomiting in pregnancy     PLAN Discharge home reassured. Try temporarily stopping PNV and adjust diet to avoid gas-producing foods   Medication List    TAKE these medications       CONCEPT OB 130-92.4-1 MG Caps  Take 1 tablet by mouth daily.     ondansetron 8 MG tablet  Commonly known as:  ZOFRAN  Take 1 tablet (8 mg total) by mouth every 8 (eight) hours as needed for nausea.     simethicone 80 MG chewable tablet  Commonly known as:  GAS-X  Chew 1 tablet (80 mg total) by mouth every 6 (six) hours as needed for  flatulence.       Follow-up Information   Follow up with [facility]. (Keep scheduled appointment)    Contact information:   [address] [place] [address] [phone]       Danae Orleans, CNM 02/04/2013  5:42 PM

## 2013-02-04 NOTE — MAU Note (Signed)
Pt presents with complaints of abdominal pain for approximately 1 month. States she is having severe gas pain and was evaluated for it a month ago and it hasnt gotten any better.

## 2013-02-21 ENCOUNTER — Ambulatory Visit (INDEPENDENT_AMBULATORY_CARE_PROVIDER_SITE_OTHER): Payer: Medicaid Other | Admitting: Advanced Practice Midwife

## 2013-02-21 VITALS — BP 112/66 | Temp 97.8°F | Wt 160.0 lb

## 2013-02-21 DIAGNOSIS — O2390 Unspecified genitourinary tract infection in pregnancy, unspecified trimester: Secondary | ICD-10-CM

## 2013-02-21 DIAGNOSIS — O09299 Supervision of pregnancy with other poor reproductive or obstetric history, unspecified trimester: Secondary | ICD-10-CM

## 2013-02-21 DIAGNOSIS — R7309 Other abnormal glucose: Secondary | ICD-10-CM

## 2013-02-21 DIAGNOSIS — Z3482 Encounter for supervision of other normal pregnancy, second trimester: Secondary | ICD-10-CM

## 2013-02-21 DIAGNOSIS — N83209 Unspecified ovarian cyst, unspecified side: Secondary | ICD-10-CM

## 2013-02-21 DIAGNOSIS — O2341 Unspecified infection of urinary tract in pregnancy, first trimester: Secondary | ICD-10-CM

## 2013-02-21 DIAGNOSIS — Z3492 Encounter for supervision of normal pregnancy, unspecified, second trimester: Secondary | ICD-10-CM

## 2013-02-21 DIAGNOSIS — O09292 Supervision of pregnancy with other poor reproductive or obstetric history, second trimester: Secondary | ICD-10-CM

## 2013-02-21 DIAGNOSIS — O34219 Maternal care for unspecified type scar from previous cesarean delivery: Secondary | ICD-10-CM

## 2013-02-21 LAB — POCT URINALYSIS DIP (DEVICE)
Bilirubin Urine: NEGATIVE
Hgb urine dipstick: NEGATIVE
Ketones, ur: NEGATIVE mg/dL
Specific Gravity, Urine: 1.015 (ref 1.005–1.030)
pH: 6.5 (ref 5.0–8.0)

## 2013-02-21 NOTE — Progress Notes (Signed)
Pulse- 96 Patient reports some lower abdominal pain

## 2013-02-21 NOTE — Patient Instructions (Signed)
Asymptomatic Bacteriuria, Female Your urine study shows bacteria in your urine. You do not have the usual symptoms of burning or frequent urination. This is why it is called asymptomatic. You may need treatment with antibiotics. Treatment is especially important if you are pregnant. Sometimes this condition can progress to a more severe bladder or kidney infection. Symptoms include burning when urinating, back pain, fever, nausea, or vomiting. Take your antibiotics as directed. Finish them even if you start to feel better. Drink enough water and fluids to keep your urine clear or pale yellow. Go to the bathroom more frequently to keep your bladder empty. Keep the area around the vagina and rectum clean. Wipe yourself from front to back after urinating. Call your caregiver to arrange for follow-up care.  SEEK IMMEDIATE MEDICAL CARE IF:  You develop repeated vomiting.  You develop severe back or abdominal pain.  You have abnormal vaginal discharge or bleeding.  You have blood in the urine.  You develop cramping or abdominal pain.  You have a fever. If you are pregnant and develop any of the above problems see your caregiver or seek care immediately. Document Released: 11/29/2005 Document Revised: 02/21/2012 Document Reviewed: 10/15/2009 [facility] Patient Information 2013 [place], [place].  Vaginal Birth After Cesarean Delivery Vaginal birth after Cesarean delivery (VBAC) is giving birth vaginally after previously delivering a baby by a cesarean. In the past, if a woman had a Cesarean delivery, all births afterwards would be done by Cesarean delivery. This is no longer true. It can be safe for the mother to try a vaginal delivery after having a Cesarean. The final decision to have a VBAC or repeat Cesarean delivery should be between the patient and her caregiver. The risks and benefits can be discussed relative to the reason for, and the type of the previous Cesarean delivery. WOMEN WHO PLAN TO HAVE  A VBAC SHOULD CHECK WITH THEIR DOCTOR TO BE SURE THAT:  The previous Cesarean was done with a low transverse uterine incision (not a vertical classical incision).  The birth canal is big enough for the baby.  There were no other operations on the uterus.  They will have an electronic fetal monitor (EFM) on at all times during labor.  An operating room would be available and ready in case an emergency Cesarean is needed.  A doctor and surgical nursing staff would be available at all times during labor to be ready to do an emergency Cesarean if necessary.  An anesthesiologist would be present in case an emergency Cesarean is needed.  The nursery is prepared and has adequate personnel and necessary equipment available to care for the baby in case of an emergency Cesarean. BENEFITS OF VBAC:  Shorter stay in the hospital.  Lower delivery, nursery and hospital costs.  Less blood loss and need for blood transfusions.  Less fever and discomfort from major surgery.  Lower risk of blood clots.  Lower risk of infection.  Shorter recovery after going home.  Lower risk of other surgical complications, such as opening of the incision or hernia in the incision.  Decreased risk of injury to other organs.  Decreased risk for having to remove the uterus (hysterectomy).  Decreased risk for the placenta to completely or partially cover the opening of the uterus (placenta previa) with a future pregnancy.  Ability to have a larger family if desired. RISKS OF A VBAC:  Rupture of the uterus.  Having to remove the uterus (hysterectomy) if it ruptures.  All the complications of  major surgery and/or injury to other organs.  Excessive bleeding, blood clots and infection.  Lower Apgar scores (method to evaluate the newborn based on appearance, pulse, grimace, activity, and respiration) and more risks to the baby.  There is a higher risk of uterine rupture if you induce or augment  labor.  There is a higher risk of uterine rupture if you use medications to ripen the cervix. VBAC SHOULD NOT BE DONE IF:  The previous Cesarean was done with a vertical (classical) or T-shaped incision, or you do not know what kind of an incision was made.  You had a ruptured uterus.  You had surgery on your uterus.  You have medical or obstetrical problems.  There are problems with the baby.  There were two previous Cesarean deliveries and no vaginal deliveries. OTHER FACTS TO KNOW ABOUT VBAC:  It is safe to have an epidural anesthetic with VBAC.  It is safe to turn the baby from a breech position (attempt an external cephalic version).  It is safe to try a VBAC with twins.  Pregnancies later than 40 weeks have not been successful with VBAC.  There is an increased failure rate of a VBAC in obese pregnant women.  There is an increased failure rate with VABC if the baby weighs 8.8 pounds (4000 grams) or more.  There is an increased failure rate if the time between the Cesarean and VBAC is less than 19 months.  There is an increased failure rate if pre-eclampsia is present (high blood pressure, protein in the urine and swelling of face and extremities).  VBAC is very successful if there was a previous vaginal birth.  VBAC is very successful when the labor starts spontaneously before the due date.  Delivery of VBAC is similar to having a normal spontaneous vaginal delivery. It is important to discuss VBAC with your caregiver early in the pregnancy so you can understand the risks, benefits and options. It will give you time to decide what is best in your particular case relevant to the reason for your previous Cesarean delivery. It should be understood that medical changes in the mother or pregnancy may occur during the pregnancy, which make it necessary to change you or your caregiver's initial decision. The counseling, concerns and decisions should be documented in the medical  record and signed by all parties. Document Released: 05/22/2007 Document Revised: 02/21/2012 Document Reviewed: 01/10/2009 [facility] Patient Information 2013 [place], [place].

## 2013-02-21 NOTE — Progress Notes (Signed)
VBAC consent signed. Draw quad at NV. Urine TOC done. No UTI Sx. N/V improving, but down 8 lb. Watch wt. Early 1 hour GTT 143. 1 hour ordered.

## 2013-02-23 LAB — CULTURE, OB URINE

## 2013-02-24 DIAGNOSIS — O09299 Supervision of pregnancy with other poor reproductive or obstetric history, unspecified trimester: Secondary | ICD-10-CM | POA: Insufficient documentation

## 2013-02-26 ENCOUNTER — Other Ambulatory Visit: Payer: Self-pay

## 2013-02-28 ENCOUNTER — Other Ambulatory Visit: Payer: Self-pay

## 2013-03-05 ENCOUNTER — Other Ambulatory Visit: Payer: Medicaid Other

## 2013-03-06 ENCOUNTER — Other Ambulatory Visit: Payer: Medicaid Other

## 2013-03-12 ENCOUNTER — Other Ambulatory Visit: Payer: Medicaid Other

## 2013-03-12 DIAGNOSIS — R7309 Other abnormal glucose: Secondary | ICD-10-CM

## 2013-03-13 LAB — GLUCOSE TOLERANCE, 3 HOURS
Glucose Tolerance, 1 hour: 135 mg/dL (ref 70–189)
Glucose Tolerance, 2 hour: 116 mg/dL (ref 70–164)

## 2013-03-14 ENCOUNTER — Encounter: Payer: Self-pay | Admitting: Obstetrics and Gynecology

## 2013-03-19 ENCOUNTER — Ambulatory Visit (HOSPITAL_COMMUNITY)
Admission: RE | Admit: 2013-03-19 | Discharge: 2013-03-19 | Disposition: A | Discharge status: Home / Self Care | Payer: Medicaid Other | Source: Ambulatory Visit | Attending: Advanced Practice Midwife | Admitting: Advanced Practice Midwife

## 2013-03-19 DIAGNOSIS — Z3482 Encounter for supervision of other normal pregnancy, second trimester: Secondary | ICD-10-CM

## 2013-03-19 DIAGNOSIS — O358XX0 Maternal care for other (suspected) fetal abnormality and damage, not applicable or unspecified: Secondary | ICD-10-CM | POA: Insufficient documentation

## 2013-03-19 DIAGNOSIS — Z363 Encounter for antenatal screening for malformations: Secondary | ICD-10-CM | POA: Insufficient documentation

## 2013-03-19 DIAGNOSIS — Z1389 Encounter for screening for other disorder: Secondary | ICD-10-CM | POA: Insufficient documentation

## 2013-03-20 ENCOUNTER — Encounter: Payer: Self-pay | Admitting: Advanced Practice Midwife

## 2013-03-21 ENCOUNTER — Ambulatory Visit (INDEPENDENT_AMBULATORY_CARE_PROVIDER_SITE_OTHER): Payer: PRIVATE HEALTH INSURANCE | Admitting: Family Medicine

## 2013-03-21 ENCOUNTER — Other Ambulatory Visit: Payer: Self-pay | Admitting: Family Medicine

## 2013-03-21 VITALS — BP 116/64 | Temp 96.5°F | Wt 162.6 lb

## 2013-03-21 DIAGNOSIS — Z3492 Encounter for supervision of normal pregnancy, unspecified, second trimester: Secondary | ICD-10-CM

## 2013-03-21 DIAGNOSIS — O34219 Maternal care for unspecified type scar from previous cesarean delivery: Secondary | ICD-10-CM

## 2013-03-21 LAB — POCT URINALYSIS DIP (DEVICE)
Nitrite: NEGATIVE
Protein, ur: 30 mg/dL — AB
pH: 6 (ref 5.0–8.0)

## 2013-03-21 MED ORDER — DOCUSATE SODIUM 100 MG PO CAPS: 100.0000 mg | ORAL_CAPSULE | Freq: Every day | ORAL | Status: DC

## 2013-03-21 NOTE — Progress Notes (Signed)
3 hr GTT negative. Ultrasound normal. Quad drawn today.

## 2013-03-21 NOTE — Progress Notes (Signed)
Pulse- 81 Patient reports "LLQ pain that feels similar to contractions", reports occasional pelvic pressure

## 2013-03-21 NOTE — Patient Instructions (Addendum)
Pregnancy - Second Trimester The second trimester of pregnancy (3 to 6 months) is a period of rapid growth for you and your baby. At the end of the sixth month, your baby is about 9 inches long and weighs 1 1/2 pounds. You will begin to feel the baby move between 18 and 20 weeks of the pregnancy. This is called quickening. Weight gain is faster. A clear fluid (colostrum) may leak out of your breasts. You may feel small contractions of the womb (uterus). This is known as false labor or Braxton-Hicks contractions. This is like a practice for labor when the baby is ready to be born. Usually, the problems with morning sickness have usually passed by the end of your first trimester. Some women develop small dark blotches (called cholasma, mask of pregnancy) on their face that usually goes away after the baby is born. Exposure to the sun makes the blotches worse. Acne may also develop in some pregnant women and pregnant women who have acne, may find that it goes away. PRENATAL EXAMS  Blood work may continue to be done during prenatal exams. These tests are done to check on your health and the probable health of your baby. Blood work is used to follow your blood levels (hemoglobin). Anemia (low hemoglobin) is common during pregnancy. Iron and vitamins are given to help prevent this. You will also be checked for diabetes between 24 and 28 weeks of the pregnancy. Some of the previous blood tests may be repeated.  The size of the uterus is measured during each visit. This is to make sure that the baby is continuing to grow properly according to the dates of the pregnancy.  Your blood pressure is checked every prenatal visit. This is to make sure you are not getting toxemia.  Your urine is checked to make sure you do not have an infection, diabetes or protein in the urine.  Your weight is checked often to make sure gains are happening at the suggested rate. This is to ensure that both you and your baby are growing  normally.  Sometimes, an ultrasound is performed to confirm the proper growth and development of the baby. This is a test which bounces harmless sound waves off the baby so your caregiver can more accurately determine due dates. Sometimes, a specialized test is done on the amniotic fluid surrounding the baby. This test is called an amniocentesis. The amniotic fluid is obtained by sticking a needle into the belly (abdomen). This is done to check the chromosomes in instances where there is a concern about possible genetic problems with the baby. It is also sometimes done near the end of pregnancy if an early delivery is required. In this case, it is done to help make sure the baby's lungs are mature enough for the baby to live outside of the womb. CHANGES OCCURING IN THE SECOND TRIMESTER OF PREGNANCY Your body goes through many changes during pregnancy. They vary from person to person. Talk to your caregiver about changes you notice that you are concerned about.  During the second trimester, you will likely have an increase in your appetite. It is normal to have cravings for certain foods. This varies from person to person and pregnancy to pregnancy.  Your lower abdomen will begin to bulge.  You may have to urinate more often because the uterus and baby are pressing on your bladder. It is also common to get more bladder infections during pregnancy (pain with urination). You can help this by   drinking lots of fluids and emptying your bladder before and after intercourse.  You may begin to get stretch marks on your hips, abdomen, and breasts. These are normal changes in the body during pregnancy. There are no exercises or medications to take that prevent this change.  You may begin to develop swollen and bulging veins (varicose veins) in your legs. Wearing support hose, elevating your feet for 15 minutes, 3 to 4 times a day and limiting salt in your diet helps lessen the problem.  Heartburn may develop  as the uterus grows and pushes up against the stomach. Antacids recommended by your caregiver helps with this problem. Also, eating smaller meals 4 to 5 times a day helps.  Constipation can be treated with a stool softener or adding bulk to your diet. Drinking lots of fluids, vegetables, fruits, and whole grains are helpful.  Exercising is also helpful. If you have been very active up until your pregnancy, most of these activities can be continued during your pregnancy. If you have been less active, it is helpful to start an exercise program such as walking.  Hemorrhoids (varicose veins in the rectum) may develop at the end of the second trimester. Warm sitz baths and hemorrhoid cream recommended by your caregiver helps hemorrhoid problems.  Backaches may develop during this time of your pregnancy. Avoid heavy lifting, wear low heal shoes and practice good posture to help with backache problems.  Some pregnant women develop tingling and numbness of their hand and fingers because of swelling and tightening of ligaments in the wrist (carpel tunnel syndrome). This goes away after the baby is born.  As your breasts enlarge, you may have to get a bigger bra. Get a comfortable, cotton, support bra. Do not get a nursing bra until the last month of the pregnancy if you will be nursing the baby.  You may get a dark line from your belly button to the pubic area called the linea nigra.  You may develop rosy cheeks because of increase blood flow to the face.  You may develop spider looking lines of the face, neck, arms and chest. These go away after the baby is born. HOME CARE INSTRUCTIONS   It is extremely important to avoid all smoking, herbs, alcohol, and unprescribed drugs during your pregnancy. These chemicals affect the formation and growth of the baby. Avoid these chemicals throughout the pregnancy to ensure the delivery of a healthy infant.  Most of your home care instructions are the same as  suggested for the first trimester of your pregnancy. Keep your caregiver's appointments. Follow your caregiver's instructions regarding medication use, exercise and diet.  During pregnancy, you are providing food for you and your baby. Continue to eat regular, well-balanced meals. Choose foods such as meat, fish, milk and other low fat dairy products, vegetables, fruits, and whole-grain breads and cereals. Your caregiver will tell you of the ideal weight gain.  A physical sexual relationship may be continued up until near the end of pregnancy if there are no other problems. Problems could include early (premature) leaking of amniotic fluid from the membranes, vaginal bleeding, abdominal pain, or other medical or pregnancy problems.  Exercise regularly if there are no restrictions. Check with your caregiver if you are unsure of the safety of some of your exercises. The greatest weight gain will occur in the last 2 trimesters of pregnancy. Exercise will help you:  Control your weight.  Get you in shape for labor and delivery.  Lose weight   after you have the baby.  Wear a good support or jogging bra for breast tenderness during pregnancy. This may help if worn during sleep. Pads or tissues may be used in the bra if you are leaking colostrum.  Do not use hot tubs, steam rooms or saunas throughout the pregnancy.  Wear your seat belt at all times when driving. This protects you and your baby if you are in an accident.  Avoid raw meat, uncooked cheese, cat litter boxes and soil used by cats. These carry germs that can cause birth defects in the baby.  The second trimester is also a good time to visit your dentist for your dental health if this has not been done yet. Getting your teeth cleaned is OK. Use a soft toothbrush. Brush gently during pregnancy.  It is easier to loose urine during pregnancy. Tightening up and strengthening the pelvic muscles will help with this problem. Practice stopping your  urination while you are going to the bathroom. These are the same muscles you need to strengthen. It is also the muscles you would use as if you were trying to stop from passing gas. You can practice tightening these muscles up 10 times a set and repeating this about 3 times per day. Once you know what muscles to tighten up, do not perform these exercises during urination. It is more likely to contribute to an infection by backing up the urine.  Ask for help if you have financial, counseling or nutritional needs during pregnancy. Your caregiver will be able to offer counseling for these needs as well as refer you for other special needs.  Your skin may become oily. If so, wash your face with mild soap, use non-greasy moisturizer and oil or cream based makeup. MEDICATIONS AND DRUG USE IN PREGNANCY  Take prenatal vitamins as directed. The vitamin should contain 1 milligram of folic acid. Keep all vitamins out of reach of children. Only a couple vitamins or tablets containing iron may be fatal to a baby or young child when ingested.  Avoid use of all medications, including herbs, over-the-counter medications, not prescribed or suggested by your caregiver. Only take over-the-counter or prescription medicines for pain, discomfort, or fever as directed by your caregiver. Do not use aspirin.  Let your caregiver also know about herbs you may be using.  Alcohol is related to a number of birth defects. This includes fetal alcohol syndrome. All alcohol, in any form, should be avoided completely. Smoking will cause low birth rate and premature babies.  Street or illegal drugs are very harmful to the baby. They are absolutely forbidden. A baby born to an addicted mother will be addicted at birth. The baby will go through the same withdrawal an adult does. SEEK MEDICAL CARE IF:  You have any concerns or worries during your pregnancy. It is better to call with your questions if you feel they cannot wait, rather  than worry about them. SEEK IMMEDIATE MEDICAL CARE IF:   An unexplained oral temperature above 102 F (38.9 C) develops, or as your caregiver suggests.  You have leaking of fluid from the vagina (birth canal). If leaking membranes are suspected, take your temperature and tell your caregiver of this when you call.  There is vaginal spotting, bleeding, or passing clots. Tell your caregiver of the amount and how many pads are used. Light spotting in pregnancy is common, especially following intercourse.  You develop a bad smelling vaginal discharge with a change in the color from clear   to white.  You continue to feel sick to your stomach (nauseated) and have no relief from remedies suggested. You vomit blood or coffee ground-like materials.  You lose more than 2 pounds of weight or gain more than 2 pounds of weight over 1 week, or as suggested by your caregiver.  You notice swelling of your face, hands, feet, or legs.  You get exposed to German measles and have never had them.  You are exposed to fifth disease or chickenpox.  You develop belly (abdominal) pain. Round ligament discomfort is a common non-cancerous (benign) cause of abdominal pain in pregnancy. Your caregiver still must evaluate you.  You develop a bad headache that does not go away.  You develop fever, diarrhea, pain with urination, or shortness of breath.  You develop visual problems, blurry, or double vision.  You fall or are in a car accident or any kind of trauma.  There is mental or physical violence at home. Document Released: 11/23/2001 Document Revised: 02/21/2012 Document Reviewed: 05/28/2009 [employer] Patient Information 2013 [employer]  AFP Maternal This is a routine screen (tests) used to check for fetal abnormalities such as Down syndrome and neural tube defects. Down Syndrome is a chromosomal abnormality, sometimes called Trisomy 21. Neural tube defects are serious birth defects. The brain, spinal  cord, or their coverings do not develop completely. Women should be tested in the 15th to 20th week of pregnancy. The msAFP screen involves three or four tests that measure substances found in the blood that make the testing better. During development, AFP levels in fetal blood and amniotic fluid rise until about 12 weeks. The levels then gradually fall until birth. AFP is a protein produce by fetal tissue. AFP crosses the placenta and appears in the maternal blood. A baby with an open neural tube defect has an opening in its spine, head, or abdominal wall that allows higher-than-usual amounts of AFP to pass into the mother's blood. If a screen is positive, more tests are needed to make a diagnosis. These include ultrasound and perhaps amniocentesis (checking the fluid that surrounds the baby). These tests are used to help women and their caregivers make decisions about the management of their pregnancies. In pregnancies where the fetus is carrying the chromosomal defect that results in Down syndrome, the levels of AFP and unconjugated estriol tend to be low and hCG and inhibin A levels high.  PREPARATION FOR TEST Blood is drawn from a vein in your arm usually between the 15th and 20th weeks of pregnancy. Four different tests on your blood are done. These are AFP, hCG, unconjugated estriol, and inhibin A. The combination of tests produces a more accurate result. NORMAL FINDINGS   Adult: less than 40ng/mL or less than 40 mg/L (SI units)  Child younger than1 year: less than 30 ng/mL Ranges are stratified by weeks of gestation and vary among laboratories. Ranges for normal findings may vary among different laboratories and hospitals. You should always check with your doctor after having lab work or other tests done to discuss the meaning of your test results and whether your values are considered within normal limits. MEANING OF TEST  These are screening tests. Not all fetal abnormalities will give  positive test results. Of all women who have positive AFP screening results, only a very small number of them have babies who actually have a neural tube defect or chromosomal abnormality. Your caregiver will go over the test results with you and discuss the importance and meaning of   your results, as well as treatment options and the need for additional tests if necessary. OBTAINING THE TEST RESULTS It is your responsibility to obtain your test results. Ask the lab or department performing the test when and how you will get your results. Document Released: 12/21/2004 Document Revised: 02/21/2012 Document Reviewed: 11/02/2008 [employer] Patient Information 2013 [employer]   

## 2013-03-22 ENCOUNTER — Encounter: Payer: Self-pay | Admitting: Advanced Practice Midwife

## 2013-03-30 ENCOUNTER — Encounter: Payer: Self-pay | Admitting: Family Medicine

## 2013-04-18 ENCOUNTER — Ambulatory Visit (INDEPENDENT_AMBULATORY_CARE_PROVIDER_SITE_OTHER): Payer: Medicaid Other | Admitting: Family Medicine

## 2013-04-18 VITALS — BP 120/65 | Temp 97.0°F | Wt 167.9 lb

## 2013-04-18 DIAGNOSIS — Z3490 Encounter for supervision of normal pregnancy, unspecified, unspecified trimester: Secondary | ICD-10-CM

## 2013-04-18 DIAGNOSIS — O34219 Maternal care for unspecified type scar from previous cesarean delivery: Secondary | ICD-10-CM

## 2013-04-18 LAB — POCT URINALYSIS DIP (DEVICE)
Leukocytes, UA: NEGATIVE
Protein, ur: 30 mg/dL — AB
Specific Gravity, Urine: 1.02 (ref 1.005–1.030)
Urobilinogen, UA: 0.2 mg/dL (ref 0.0–1.0)
pH: 6 (ref 5.0–8.0)

## 2013-04-18 NOTE — Patient Instructions (Addendum)
Pregnancy - Second Trimester The second trimester of pregnancy (3 to 6 months) is a period of rapid growth for you and your baby. At the end of the sixth month, your baby is about 9 inches long and weighs 1 1/2 pounds. You will begin to feel the baby move between 18 and 20 weeks of the pregnancy. This is called quickening. Weight gain is faster. A clear fluid (colostrum) may leak out of your breasts. You may feel small contractions of the womb (uterus). This is known as false labor or Braxton-Hicks contractions. This is like a practice for labor when the baby is ready to be born. Usually, the problems with morning sickness have usually passed by the end of your first trimester. Some women develop small dark blotches (called cholasma, mask of pregnancy) on their face that usually goes away after the baby is born. Exposure to the sun makes the blotches worse. Acne may also develop in some pregnant women and pregnant women who have acne, may find that it goes away. PRENATAL EXAMS  Blood work may continue to be done during prenatal exams. These tests are done to check on your health and the probable health of your baby. Blood work is used to follow your blood levels (hemoglobin). Anemia (low hemoglobin) is common during pregnancy. Iron and vitamins are given to help prevent this. You will also be checked for diabetes between 24 and 28 weeks of the pregnancy. Some of the previous blood tests may be repeated.  The size of the uterus is measured during each visit. This is to make sure that the baby is continuing to grow properly according to the dates of the pregnancy.  Your blood pressure is checked every prenatal visit. This is to make sure you are not getting toxemia.  Your urine is checked to make sure you do not have an infection, diabetes or protein in the urine.  Your weight is checked often to make sure gains are happening at the suggested rate. This is to ensure that both you and your baby are growing  normally.  Sometimes, an ultrasound is performed to confirm the proper growth and development of the baby. This is a test which bounces harmless sound waves off the baby so your caregiver can more accurately determine due dates. Sometimes, a specialized test is done on the amniotic fluid surrounding the baby. This test is called an amniocentesis. The amniotic fluid is obtained by sticking a needle into the belly (abdomen). This is done to check the chromosomes in instances where there is a concern about possible genetic problems with the baby. It is also sometimes done near the end of pregnancy if an early delivery is required. In this case, it is done to help make sure the baby's lungs are mature enough for the baby to live outside of the womb. CHANGES OCCURING IN THE SECOND TRIMESTER OF PREGNANCY Your body goes through many changes during pregnancy. They vary from person to person. Talk to your caregiver about changes you notice that you are concerned about.  During the second trimester, you will likely have an increase in your appetite. It is normal to have cravings for certain foods. This varies from person to person and pregnancy to pregnancy.  Your lower abdomen will begin to bulge.  You may have to urinate more often because the uterus and baby are pressing on your bladder. It is also common to get more bladder infections during pregnancy (pain with urination). You can help this by   drinking lots of fluids and emptying your bladder before and after intercourse.  You may begin to get stretch marks on your hips, abdomen, and breasts. These are normal changes in the body during pregnancy. There are no exercises or medications to take that prevent this change.  You may begin to develop swollen and bulging veins (varicose veins) in your legs. Wearing support hose, elevating your feet for 15 minutes, 3 to 4 times a day and limiting salt in your diet helps lessen the problem.  Heartburn may develop  as the uterus grows and pushes up against the stomach. Antacids recommended by your caregiver helps with this problem. Also, eating smaller meals 4 to 5 times a day helps.  Constipation can be treated with a stool softener or adding bulk to your diet. Drinking lots of fluids, vegetables, fruits, and whole grains are helpful.  Exercising is also helpful. If you have been very active up until your pregnancy, most of these activities can be continued during your pregnancy. If you have been less active, it is helpful to start an exercise program such as walking.  Hemorrhoids (varicose veins in the rectum) may develop at the end of the second trimester. Warm sitz baths and hemorrhoid cream recommended by your caregiver helps hemorrhoid problems.  Backaches may develop during this time of your pregnancy. Avoid heavy lifting, wear low heal shoes and practice good posture to help with backache problems.  Some pregnant women develop tingling and numbness of their hand and fingers because of swelling and tightening of ligaments in the wrist (carpel tunnel syndrome). This goes away after the baby is born.  As your breasts enlarge, you may have to get a bigger bra. Get a comfortable, cotton, support bra. Do not get a nursing bra until the last month of the pregnancy if you will be nursing the baby.  You may get a dark line from your belly button to the pubic area called the linea nigra.  You may develop rosy cheeks because of increase blood flow to the face.  You may develop spider looking lines of the face, neck, arms and chest. These go away after the baby is born. HOME CARE INSTRUCTIONS   It is extremely important to avoid all smoking, herbs, alcohol, and unprescribed drugs during your pregnancy. These chemicals affect the formation and growth of the baby. Avoid these chemicals throughout the pregnancy to ensure the delivery of a healthy infant.  Most of your home care instructions are the same as  suggested for the first trimester of your pregnancy. Keep your caregiver's appointments. Follow your caregiver's instructions regarding medication use, exercise and diet.  During pregnancy, you are providing food for you and your baby. Continue to eat regular, well-balanced meals. Choose foods such as meat, fish, milk and other low fat dairy products, vegetables, fruits, and whole-grain breads and cereals. Your caregiver will tell you of the ideal weight gain.  A physical sexual relationship may be continued up until near the end of pregnancy if there are no other problems. Problems could include early (premature) leaking of amniotic fluid from the membranes, vaginal bleeding, abdominal pain, or other medical or pregnancy problems.  Exercise regularly if there are no restrictions. Check with your caregiver if you are unsure of the safety of some of your exercises. The greatest weight gain will occur in the last 2 trimesters of pregnancy. Exercise will help you:  Control your weight.  Get you in shape for labor and delivery.  Lose weight   after you have the baby.  Wear a good support or jogging bra for breast tenderness during pregnancy. This may help if worn during sleep. Pads or tissues may be used in the bra if you are leaking colostrum.  Do not use hot tubs, steam rooms or saunas throughout the pregnancy.  Wear your seat belt at all times when driving. This protects you and your baby if you are in an accident.  Avoid raw meat, uncooked cheese, cat litter boxes and soil used by cats. These carry germs that can cause birth defects in the baby.  The second trimester is also a good time to visit your dentist for your dental health if this has not been done yet. Getting your teeth cleaned is OK. Use a soft toothbrush. Brush gently during pregnancy.  It is easier to loose urine during pregnancy. Tightening up and strengthening the pelvic muscles will help with this problem. Practice stopping your  urination while you are going to the bathroom. These are the same muscles you need to strengthen. It is also the muscles you would use as if you were trying to stop from passing gas. You can practice tightening these muscles up 10 times a set and repeating this about 3 times per day. Once you know what muscles to tighten up, do not perform these exercises during urination. It is more likely to contribute to an infection by backing up the urine.  Ask for help if you have financial, counseling or nutritional needs during pregnancy. Your caregiver will be able to offer counseling for these needs as well as refer you for other special needs.  Your skin may become oily. If so, wash your face with mild soap, use non-greasy moisturizer and oil or cream based makeup. MEDICATIONS AND DRUG USE IN PREGNANCY  Take prenatal vitamins as directed. The vitamin should contain 1 milligram of folic acid. Keep all vitamins out of reach of children. Only a couple vitamins or tablets containing iron may be fatal to a baby or young child when ingested.  Avoid use of all medications, including herbs, over-the-counter medications, not prescribed or suggested by your caregiver. Only take over-the-counter or prescription medicines for pain, discomfort, or fever as directed by your caregiver. Do not use aspirin.  Let your caregiver also know about herbs you may be using.  Alcohol is related to a number of birth defects. This includes fetal alcohol syndrome. All alcohol, in any form, should be avoided completely. Smoking will cause low birth rate and premature babies.  Street or illegal drugs are very harmful to the baby. They are absolutely forbidden. A baby born to an addicted mother will be addicted at birth. The baby will go through the same withdrawal an adult does. SEEK MEDICAL CARE IF:  You have any concerns or worries during your pregnancy. It is better to call with your questions if you feel they cannot wait, rather  than worry about them. SEEK IMMEDIATE MEDICAL CARE IF:   An unexplained oral temperature above 102 F (38.9 C) develops, or as your caregiver suggests.  You have leaking of fluid from the vagina (birth canal). If leaking membranes are suspected, take your temperature and tell your caregiver of this when you call.  There is vaginal spotting, bleeding, or passing clots. Tell your caregiver of the amount and how many pads are used. Light spotting in pregnancy is common, especially following intercourse.  You develop a bad smelling vaginal discharge with a change in the color from clear   to white.  You continue to feel sick to your stomach (nauseated) and have no relief from remedies suggested. You vomit blood or coffee ground-like materials.  You lose more than 2 pounds of weight or gain more than 2 pounds of weight over 1 week, or as suggested by your caregiver.  You notice swelling of your face, hands, feet, or legs.  You get exposed to [place] measles and have never had them.  You are exposed to fifth disease or chickenpox.  You develop belly (abdominal) pain. Round ligament discomfort is a common non-cancerous (benign) cause of abdominal pain in pregnancy. Your caregiver still must evaluate you.  You develop a bad headache that does not go away.  You develop fever, diarrhea, pain with urination, or shortness of breath.  You develop visual problems, blurry, or double vision.  You fall or are in a car accident or any kind of trauma.  There is mental or physical violence at home. Document Released: 11/23/2001 Document Revised: 02/21/2012 Document Reviewed: 05/28/2009 [facility] Patient Information 2013 [place], [place].  Round Ligament Pain The round ligament is made up of muscle and fibrous tissue. It is attached to the uterus near the fallopian tube. The round ligament is located on both sides of the uterus and helps support the position of the uterus. It usually begins in the second  trimester of pregnancy when the uterus comes out of the pelvis. The pain can come and go until the baby is delivered. Round ligament pain is not a serious problem and does not cause harm to the baby. CAUSE During pregnancy the uterus grows the most from the second trimester to delivery. As it grows, it stretches and slightly twists the round ligaments. When the uterus leans from one side to the other, the round ligament on the opposite side pulls and stretches. This can cause pain. SYMPTOMS  Pain can occur on one side or both sides. The pain is usually a short, sharp, and pinching-like. Sometimes it can be a dull, lingering and aching pain. The pain is located in the lower side of the abdomen or in the groin. The pain is internal and usually starts deep in the groin and moves up to the outside of the hip area. Pain can occur with:  Sudden change in position like getting out of bed or a chair.  Rolling over in bed.  Coughing or sneezing.  Walking too much.  Any type of physical activity. DIAGNOSIS  Your caregiver will make sure there are no serious problems causing the pain. When nothing serious is found, the symptoms usually indicate that the pain is from the round ligament. TREATMENT   Sit down and relax when the pain starts.  Flex your knees up to your belly.  Lay on your side with a pillow under your belly (abdomen) and another one between your legs.  Sit in a hot bath for 15 to 20 minutes or until the pain goes away. HOME CARE INSTRUCTIONS   Only take over-the-counter or prescriptions medicines for pain, discomfort or fever as directed by your caregiver.  Sit and stand slowly.  Avoid long walks if it causes pain.  Stop or lessen your physical activities if it causes pain. SEEK MEDICAL CARE IF:   The pain does not go away with any of your treatment.  You need stronger medication for the pain.  You develop back pain that you did not have before with the side pain. SEEK  IMMEDIATE MEDICAL CARE IF:   You  develop a temperature of 102 F (38.9 C) or higher.  You develop uterine contractions.  You develop vaginal bleeding.  You develop nausea, vomiting or diarrhea.  You develop chills.  You have pain when you urinate. Document Released: 09/07/2008 Document Revised: 02/21/2012 Document Reviewed: 09/07/2008 [facility] Patient Information 2013 [place], [place].

## 2013-04-18 NOTE — Progress Notes (Signed)
Pulse- 95  Pain-left side

## 2013-05-16 ENCOUNTER — Ambulatory Visit (INDEPENDENT_AMBULATORY_CARE_PROVIDER_SITE_OTHER): Payer: PRIVATE HEALTH INSURANCE | Admitting: Advanced Practice Midwife

## 2013-05-16 VITALS — BP 103/63 | Temp 97.2°F | Wt 171.0 lb

## 2013-05-16 DIAGNOSIS — Z3483 Encounter for supervision of other normal pregnancy, third trimester: Secondary | ICD-10-CM

## 2013-05-16 DIAGNOSIS — Z23 Encounter for immunization: Secondary | ICD-10-CM

## 2013-05-16 DIAGNOSIS — O34219 Maternal care for unspecified type scar from previous cesarean delivery: Secondary | ICD-10-CM

## 2013-05-16 LAB — POCT URINALYSIS DIP (DEVICE)
Glucose, UA: NEGATIVE mg/dL
Hgb urine dipstick: NEGATIVE
Nitrite: NEGATIVE
Protein, ur: NEGATIVE mg/dL
Urobilinogen, UA: 0.2 mg/dL (ref 0.0–1.0)

## 2013-05-16 MED ORDER — TETANUS-DIPHTH-ACELL PERTUSSIS 5-2.5-18.5 LF-MCG/0.5 IM SUSP
0.5000 mL | Freq: Once | INTRAMUSCULAR | Status: AC
  Administered 2013-05-16: 0.5 mL via INTRAMUSCULAR

## 2013-05-16 NOTE — Progress Notes (Signed)
I have seen the patient with the resident/student and agree with the above.  Hogan, Heather Donovan  

## 2013-05-16 NOTE — Addendum Note (Signed)
Addended by: Faythe Casa on: 05/16/2013 10:36 AM   Modules accepted: Orders

## 2013-05-16 NOTE — Progress Notes (Signed)
Sheena Hernandez is a 32 y.o. female [redacted]w[redacted]d  Here for routine pre-natal exam. Complains of some lower back pain that is improved. Misunderstood that she was suppose to take vitamins, and she will restart them.  O: BP 103/63  Temp(Src) 97.2 F (36.2 C)  Wt 171 lb (77.565 kg)  BMI 26.78 kg/m2  LMP 11/08/2012 Abd: Gravid  A/P:  - Had early Glucola d/t macrosomia, will repeat next visit - Back pain: Advised to take tylenol, heating pad or stretches. - preterm labor precautions - kick counts covered - Tdap given today - previous c/s d/t macrosomia, VBAC papers signed - O  positive - F/U: 2 weeks

## 2013-05-16 NOTE — Progress Notes (Signed)
Pulse 88   28 w glucola test wants to do next visit. C/o lower back pain.

## 2013-05-16 NOTE — Patient Instructions (Signed)

## 2013-05-30 ENCOUNTER — Encounter: Payer: Self-pay | Admitting: Advanced Practice Midwife

## 2013-06-20 ENCOUNTER — Ambulatory Visit (INDEPENDENT_AMBULATORY_CARE_PROVIDER_SITE_OTHER): Payer: Medicaid Other | Admitting: Advanced Practice Midwife

## 2013-06-20 VITALS — BP 116/71 | Temp 96.6°F | Wt 176.3 lb

## 2013-06-20 DIAGNOSIS — O09299 Supervision of pregnancy with other poor reproductive or obstetric history, unspecified trimester: Secondary | ICD-10-CM

## 2013-06-20 DIAGNOSIS — Z3493 Encounter for supervision of normal pregnancy, unspecified, third trimester: Secondary | ICD-10-CM

## 2013-06-20 DIAGNOSIS — O09293 Supervision of pregnancy with other poor reproductive or obstetric history, third trimester: Secondary | ICD-10-CM

## 2013-06-20 DIAGNOSIS — O34219 Maternal care for unspecified type scar from previous cesarean delivery: Secondary | ICD-10-CM

## 2013-06-20 LAB — POCT URINALYSIS DIP (DEVICE)
Nitrite: NEGATIVE
Urobilinogen, UA: 0.2 mg/dL (ref 0.0–1.0)
pH: 7 (ref 5.0–8.0)

## 2013-06-20 NOTE — Progress Notes (Signed)
Pulse- 101  Pain-ligament

## 2013-06-20 NOTE — Progress Notes (Signed)
Having pain along the waist. Also having trouble taking a deep breath. Discussed normal pregnancy discomforts with the patient.

## 2013-07-04 ENCOUNTER — Ambulatory Visit (INDEPENDENT_AMBULATORY_CARE_PROVIDER_SITE_OTHER): Payer: Medicaid Other | Admitting: Family Medicine

## 2013-07-04 VITALS — BP 107/69 | Temp 96.7°F | Wt 179.8 lb

## 2013-07-04 DIAGNOSIS — Z3493 Encounter for supervision of normal pregnancy, unspecified, third trimester: Secondary | ICD-10-CM

## 2013-07-04 DIAGNOSIS — O34219 Maternal care for unspecified type scar from previous cesarean delivery: Secondary | ICD-10-CM

## 2013-07-04 LAB — POCT URINALYSIS DIP (DEVICE)
Ketones, ur: NEGATIVE mg/dL
Leukocytes, UA: NEGATIVE
Nitrite: NEGATIVE
Protein, ur: NEGATIVE mg/dL
Urobilinogen, UA: 0.2 mg/dL (ref 0.0–1.0)
pH: 6.5 (ref 5.0–8.0)

## 2013-07-04 NOTE — Progress Notes (Signed)
Pulse: 82

## 2013-07-04 NOTE — Patient Instructions (Addendum)
Pregnancy - Third Trimester The third trimester of pregnancy (the last 3 months) is a period of the most rapid growth for you and your baby. The baby approaches a length of 20 inches and a weight of 6 to 10 pounds. The baby is adding on fat and getting ready for life outside your body. While inside, babies have periods of sleeping and waking, sucking thumbs, and hiccuping. You can often feel small contractions of the uterus. This is false labor. It is also called Braxton-Hicks contractions. This is like a practice for labor. The usual problems in this stage of pregnancy include more difficulty breathing, swelling of the hands and feet from water retention, and having to urinate more often because of the uterus and baby pressing on your bladder.  PRENATAL EXAMS  Blood work may continue to be done during prenatal exams. These tests are done to check on your health and the probable health of your baby. Blood work is used to follow your blood levels (hemoglobin). Anemia (low hemoglobin) is common during pregnancy. Iron and vitamins are given to help prevent this. You may also continue to be checked for diabetes. Some of the past blood tests may be done again.  The size of the uterus is measured during each visit. This makes sure your baby is growing properly according to your pregnancy dates.  Your blood pressure is checked every prenatal visit. This is to make sure you are not getting toxemia.  Your urine is checked every prenatal visit for infection, diabetes, and protein.  Your weight is checked at each visit. This is done to make sure gains are happening at the suggested rate and that you and your baby are growing normally.  Sometimes, an ultrasound is performed to confirm the position and the proper growth and development of the baby. This is a test done that bounces harmless sound waves off the baby so your caregiver can more accurately determine a due date.  Discuss the type of pain medicine and  anesthesia you will have during your labor and delivery.  Discuss the possibility and anesthesia if a cesarean section might be necessary.  Inform your caregiver if there is any mental or physical violence at home. Sometimes, a specialized non-stress test, contraction stress test, and biophysical profile are done to make sure the baby is not having a problem. Checking the amniotic fluid surrounding the baby is called an amniocentesis. The amniotic fluid is removed by sticking a needle into the belly (abdomen). This is sometimes done near the end of pregnancy if an early delivery is required. In this case, it is done to help make sure the baby's lungs are mature enough for the baby to live outside of the womb. If the lungs are not mature and it is unsafe to deliver the baby, an injection of cortisone medicine is given to the mother 1 to 2 days before the delivery. This helps the baby's lungs mature and makes it safer to deliver the baby. CHANGES OCCURING IN THE THIRD TRIMESTER OF PREGNANCY Your body goes through many changes during pregnancy. They vary from person to person. Talk to your caregiver about changes you notice and are concerned about.  During the last trimester, you have probably had an increase in your appetite. It is normal to have cravings for certain foods. This varies from person to person and pregnancy to pregnancy.  You may begin to get stretch marks on your hips, abdomen, and breasts. These are normal changes in the body   during pregnancy. There are no exercises or medicines to take which prevent this change.  Constipation may be treated with a stool softener or adding bulk to your diet. Drinking lots of fluids, fiber in vegetables, fruits, and whole grains are helpful.  Exercising is also helpful. If you have been very active up until your pregnancy, most of these activities can be continued during your pregnancy. If you have been less active, it is helpful to start an exercise  program such as walking. Consult your caregiver before starting exercise programs.  Avoid all smoking, alcohol, non-prescribed drugs, herbs and "street drugs" during your pregnancy. These chemicals affect the formation and growth of the baby. Avoid chemicals throughout the pregnancy to ensure the delivery of a healthy infant.  Backache, varicose veins, and hemorrhoids may develop or get worse.  You will tire more easily in the third trimester, which is normal.  The baby's movements may be stronger and more often.  You may become short of breath easily.  Your belly button may stick out.  A yellow discharge may leak from your breasts called colostrum.  You may have a bloody mucus discharge. This usually occurs a few days to a week before labor begins. HOME CARE INSTRUCTIONS   Keep your caregiver's appointments. Follow your caregiver's instructions regarding medicine use, exercise, and diet.  During pregnancy, you are providing food for you and your baby. Continue to eat regular, well-balanced meals. Choose foods such as meat, fish, milk and other low fat dairy products, vegetables, fruits, and whole-grain breads and cereals. Your caregiver will tell you of the ideal weight gain.  A physical sexual relationship may be continued throughout pregnancy if there are no other problems such as early (premature) leaking of amniotic fluid from the membranes, vaginal bleeding, or belly (abdominal) pain.  Exercise regularly if there are no restrictions. Check with your caregiver if you are unsure of the safety of your exercises. Greater weight gain will occur in the last 2 trimesters of pregnancy. Exercising helps:  Control your weight.  Get you in shape for labor and delivery.  You lose weight after you deliver.  Rest a lot with legs elevated, or as needed for leg cramps or low back pain.  Wear a good support or jogging bra for breast tenderness during pregnancy. This may help if worn during  sleep. Pads or tissues may be used in the bra if you are leaking colostrum.  Do not use hot tubs, steam rooms, or saunas.  Wear your seat belt when driving. This protects you and your baby if you are in an accident.  Avoid raw meat, cat litter boxes and soil used by cats. These carry germs that can cause birth defects in the baby.  It is easier to leak urine during pregnancy. Tightening up and strengthening the pelvic muscles will help with this problem. You can practice stopping your urination while you are going to the bathroom. These are the same muscles you need to strengthen. It is also the muscles you would use if you were trying to stop from passing gas. You can practice tightening these muscles up 10 times a set and repeating this about 3 times per day. Once you know what muscles to tighten up, do not perform these exercises during urination. It is more likely to cause an infection by backing up the urine.  Ask for help if you have financial, counseling, or nutritional needs during pregnancy. Your caregiver will be able to offer counseling for these   needs as well as refer you for other special needs.  Make a list of emergency phone numbers and have them available.  Plan on getting help from family or friends when you go home from the hospital.  Make a trial run to the hospital.  Take prenatal classes with the father to understand, practice, and ask questions about the labor and delivery.  Prepare the baby's room or nursery.  Do not travel out of the city unless it is absolutely necessary and with the advice of your caregiver.  Wear only low or no heal shoes to have better balance and prevent falling. MEDICINES AND DRUG USE IN PREGNANCY  Take prenatal vitamins as directed. The vitamin should contain 1 milligram of folic acid. Keep all vitamins out of reach of children. Only a couple vitamins or tablets containing iron may be fatal to a baby or young child when ingested.  Avoid use  of all medicines, including herbs, over-the-counter medicines, not prescribed or suggested by your caregiver. Only take over-the-counter or prescription medicines for pain, discomfort, or fever as directed by your caregiver. Do not use aspirin, ibuprofen or naproxen unless approved by your caregiver.  Let your caregiver also know about herbs you may be using.  Alcohol is related to a number of birth defects. This includes fetal alcohol syndrome. All alcohol, in any form, should be avoided completely. Smoking will cause low birth rate and premature babies.  Illegal drugs are very harmful to the baby. They are absolutely forbidden. A baby born to an addicted mother will be addicted at birth. The baby will go through the same withdrawal an adult does. SEEK MEDICAL CARE IF: You have any concerns or worries during your pregnancy. It is better to call with your questions if you feel they cannot wait, rather than worry about them. SEEK IMMEDIATE MEDICAL CARE IF:   An unexplained oral temperature above 102 F (38.9 C) develops, or as your caregiver suggests.  You have leaking of fluid from the vagina. If leaking membranes are suspected, take your temperature and tell your caregiver of this when you call.  There is vaginal spotting, bleeding or passing clots. Tell your caregiver of the amount and how many pads are used.  You develop a bad smelling vaginal discharge with a change in the color from clear to white.  You develop vomiting that lasts more than 24 hours.  You develop chills or fever.  You develop shortness of breath.  You develop burning on urination.  You loose more than 2 pounds of weight or gain more than 2 pounds of weight or as suggested by your caregiver.  You notice sudden swelling of your face, hands, and feet or legs.  You develop belly (abdominal) pain. Round ligament discomfort is a common non-cancerous (benign) cause of abdominal pain in pregnancy. Your caregiver still  must evaluate you.  You develop a severe headache that does not go away.  You develop visual problems, blurred or double vision.  If you have not felt your baby move for more than 1 hour. If you think the baby is not moving as much as usual, eat something with sugar in it and lie down on your left side for an hour. The baby should move at least 4 to 5 times per hour. Call right away if your baby moves less than that.  You fall, are in a car accident, or any kind of trauma.  There is mental or physical violence at home. Document Released: 11/23/2001   Document Revised: 08/23/2012 Document Reviewed: 05/28/2009 [employer] Patient Information 2014 [employer]   Vaginal Birth After Cesarean Delivery Vaginal birth after Cesarean delivery (VBAC) is giving birth vaginally after previously delivering a baby by a cesarean. In the past, if a woman had a Cesarean delivery, all births afterwards would be done by Cesarean delivery. This is no longer true. It can be safe for the mother to try a vaginal delivery after having a Cesarean. The final decision to have a VBAC or repeat Cesarean delivery should be between the patient and her caregiver. The risks and benefits can be discussed relative to the reason for, and the type of the previous Cesarean delivery. WOMEN WHO PLAN TO HAVE A VBAC SHOULD CHECK WITH THEIR DOCTOR TO BE SURE THAT:  The previous Cesarean was done with a low transverse uterine incision (not a vertical classical incision).  The birth canal is big enough for the baby.  There were no other operations on the uterus.  They will have an electronic fetal monitor (EFM) on at all times during labor.  An operating room would be available and ready in case an emergency Cesarean is needed.  A doctor and surgical nursing staff would be available at all times during labor to be ready to do an emergency Cesarean if necessary.  An anesthesiologist would be present in case an emergency Cesarean is  needed.  The nursery is prepared and has adequate personnel and necessary equipment available to care for the baby in case of an emergency Cesarean. BENEFITS OF VBAC:  Shorter stay in the hospital.  Lower delivery, nursery and hospital costs.  Less blood loss and need for blood transfusions.  Less fever and discomfort from major surgery.  Lower risk of blood clots.  Lower risk of infection.  Shorter recovery after going home.  Lower risk of other surgical complications, such as opening of the incision or hernia in the incision.  Decreased risk of injury to other organs.  Decreased risk for having to remove the uterus (hysterectomy).  Decreased risk for the placenta to completely or partially cover the opening of the uterus (placenta previa) with a future pregnancy.  Ability to have a larger family if desired. RISKS OF A VBAC:  Rupture of the uterus.  Having to remove the uterus (hysterectomy) if it ruptures.  All the complications of major surgery and/or injury to other organs.  Excessive bleeding, blood clots and infection.  Lower Apgar scores (method to evaluate the newborn based on appearance, pulse, grimace, activity, and respiration) and more risks to the baby.  There is a higher risk of uterine rupture if you induce or augment labor.  There is a higher risk of uterine rupture if you use medications to ripen the cervix. VBAC SHOULD NOT BE DONE IF:  The previous Cesarean was done with a vertical (classical) or T-shaped incision, or you do not know what kind of an incision was made.  You had a ruptured uterus.  You had surgery on your uterus.  You have medical or obstetrical problems.  There are problems with the baby.  There were two previous Cesarean deliveries and no vaginal deliveries. OTHER FACTS TO KNOW ABOUT VBAC:  It is safe to have an epidural anesthetic with VBAC.  It is safe to turn the baby from a breech position (attempt an external cephalic  version).  It is safe to try a VBAC with twins.  Pregnancies later than 40 weeks have not been successful with VBAC.  There   is an increased failure rate of a VBAC in obese pregnant women.  There is an increased failure rate with VABC if the baby weighs 8.8 pounds (4000 grams) or more.  There is an increased failure rate if the time between the Cesarean and VBAC is less than 19 months.  There is an increased failure rate if pre-eclampsia is present (high blood pressure, protein in the urine and swelling of face and extremities).  VBAC is very successful if there was a previous vaginal birth.  VBAC is very successful when the labor starts spontaneously before the due date.  Delivery of VBAC is similar to having a normal spontaneous vaginal delivery. It is important to discuss VBAC with your caregiver early in the pregnancy so you can understand the risks, benefits and options. It will give you time to decide what is best in your particular case relevant to the reason for your previous Cesarean delivery. It should be understood that medical changes in the mother or pregnancy may occur during the pregnancy, which make it necessary to change you or your caregiver's initial decision. The counseling, concerns and decisions should be documented in the medical record and signed by all parties. Document Released: 05/22/2007 Document Revised: 02/21/2012 Document Reviewed: 01/10/2009 [employer] Patient Information 2014 [employer].  

## 2013-07-04 NOTE — Progress Notes (Signed)
Contractions occasionally. No other concerns.

## 2013-07-18 ENCOUNTER — Ambulatory Visit (INDEPENDENT_AMBULATORY_CARE_PROVIDER_SITE_OTHER): Payer: Medicaid Other | Admitting: Advanced Practice Midwife

## 2013-07-18 VITALS — BP 101/69 | Temp 97.3°F | Wt 181.4 lb

## 2013-07-18 DIAGNOSIS — N949 Unspecified condition associated with female genital organs and menstrual cycle: Secondary | ICD-10-CM | POA: Insufficient documentation

## 2013-07-18 DIAGNOSIS — O34219 Maternal care for unspecified type scar from previous cesarean delivery: Secondary | ICD-10-CM

## 2013-07-18 LAB — POCT URINALYSIS DIP (DEVICE)
Glucose, UA: 100 mg/dL — AB
Ketones, ur: NEGATIVE mg/dL
Nitrite: NEGATIVE
Protein, ur: 30 mg/dL — AB
Specific Gravity, Urine: 1.025 (ref 1.005–1.030)
Urobilinogen, UA: 1 mg/dL (ref 0.0–1.0)
pH: 6 (ref 5.0–8.0)

## 2013-07-18 LAB — CBC
HCT: 33.3 % — ABNORMAL LOW (ref 36.0–46.0)
Hemoglobin: 11 g/dL — ABNORMAL LOW (ref 12.0–15.0)
MCH: 26.6 pg (ref 26.0–34.0)
MCHC: 33 g/dL (ref 30.0–36.0)
MCV: 80.4 fL (ref 78.0–100.0)
Platelets: 267 K/uL (ref 150–400)
RBC: 4.14 MIL/uL (ref 3.87–5.11)
RDW: 15.7 % — ABNORMAL HIGH (ref 11.5–15.5)
WBC: 5.1 K/uL (ref 4.0–10.5)

## 2013-07-18 LAB — OB RESULTS CONSOLE GBS: GBS: NEGATIVE

## 2013-07-18 NOTE — Progress Notes (Signed)
I have seen this patient and agree with the previous student's note. Encouraged increase in PO fluids, rest, ice,  Heat for round ligament pain.   LEFTWICH-KIRBY, LISA Certified Nurse-Midwife

## 2013-07-18 NOTE — Addendum Note (Signed)
Addended by: Franchot Mimes on: 07/18/2013 02:00 PM   Modules accepted: Orders

## 2013-07-18 NOTE — Progress Notes (Signed)
P=107,

## 2013-07-18 NOTE — Progress Notes (Signed)
Patient doing well.  Good FM.  Denies leakage of fluid, vaginal bleeding, or regular contractions.  Having bilateral, sharp pelvic pain with movement.  Discussed normal round ligament pain with patient.

## 2013-07-19 LAB — GC/CHLAMYDIA PROBE AMP: CT Probe RNA: NEGATIVE

## 2013-07-19 LAB — RPR

## 2013-07-21 LAB — CULTURE, BETA STREP (GROUP B ONLY)

## 2013-07-25 ENCOUNTER — Encounter: Payer: Self-pay | Admitting: Family Medicine

## 2013-07-25 ENCOUNTER — Ambulatory Visit (INDEPENDENT_AMBULATORY_CARE_PROVIDER_SITE_OTHER): Payer: Medicaid Other | Admitting: Family Medicine

## 2013-07-25 VITALS — BP 113/69 | Wt 182.4 lb

## 2013-07-25 DIAGNOSIS — Z3493 Encounter for supervision of normal pregnancy, unspecified, third trimester: Secondary | ICD-10-CM

## 2013-07-25 DIAGNOSIS — Z3483 Encounter for supervision of other normal pregnancy, third trimester: Secondary | ICD-10-CM

## 2013-07-25 DIAGNOSIS — O34219 Maternal care for unspecified type scar from previous cesarean delivery: Secondary | ICD-10-CM

## 2013-07-25 LAB — POCT URINALYSIS DIP (DEVICE)
Bilirubin Urine: NEGATIVE
Ketones, ur: NEGATIVE mg/dL
Protein, ur: NEGATIVE mg/dL
Specific Gravity, Urine: 1.015 (ref 1.005–1.030)
pH: 7 (ref 5.0–8.0)

## 2013-07-25 NOTE — Progress Notes (Signed)
  Subjective:    Sheena Hernandez is a 32 y.o. female being seen today for her obstetrical visit. She is at [redacted]w[redacted]d gestation. Patient reports no complaints. Fetal movement: normal.  Menstrual History: OB History   Grav Para Term Preterm Abortions TAB SAB Ect Mult Living   4 2 2  1  1   2        Patient's last menstrual period was 11/08/2012.    The following portions of the patient's history were reviewed and updated as appropriate: allergies, current medications, past family history, past medical history, past social history, past surgical history and problem list.  Review of Systems Pertinent items are noted in HPI.   Objective:    BP 113/69  Wt 182 lb 6.4 oz (82.736 kg)  BMI 28.56 kg/m2  LMP 11/08/2012 FHT: 154 BPM  Uterine Size: 36 cm  Presentations: unsure  Pelvic Exam:                          Assessment:   Sheena Hernandez is a 32 y.o. [id number] at [redacted]w[redacted]d here for routine pnc  Plan:   Discussed with Patient:  - Plans to breast feed.  All questions answered. - intermittent prenatal vitamins. - Reviewed fetal kick counts Pt to perform daily at a time when the baby is active, lie laterally with both hands on belly in quiet room and count all movements (hiccups, shoulder rolls, obvious kicks, etc); pt is to report to clinic MAU for less than 10 movements felt in a 2 hour time period-pt told as soon as she counts 10 movements the count is complete.  - Routine precautions discussed (depression, infection s/s).   Patient provided with all pertinent phone numbers for emergencies. - RTC for any VB, regular, painful cramps/ctxs occurring at a rate of >2/10 min, fever (100.5 or higher), n/v/d, any pain that is unresolving or worsening, LOF, decreased fetal movement, CP, SOB, edema -RTC in one week for next visit.  Problems: Patient Active Problem List   Diagnosis Date Noted  . Round ligament pain 07/18/2013  . History of macrosomia in infant in prior pregnancy, currently  pregnant 02/24/2013  . Previous cesarean delivery affecting pregnancy, antepartum 02/21/2013  . Supervision of normal pregnancy 08/18/2011  . Ovarian cyst 08/18/2011    To Do: 1. NTD  [ ]  Vaccines: Flu:  Tdap:  [ ]  BCM: undecided [ ]  Readiness: baby has a place to sleep, car seat, other baby necessities.  Edu: [x ] PTL precautions; [ recommended] BF class; [recommended ] childbirth class; [ ]   BF counseling;

## 2013-07-25 NOTE — Progress Notes (Signed)
Pulse: 97

## 2013-07-25 NOTE — Patient Instructions (Signed)

## 2013-08-03 ENCOUNTER — Encounter: Payer: Self-pay | Admitting: Obstetrics and Gynecology

## 2013-08-03 ENCOUNTER — Ambulatory Visit (INDEPENDENT_AMBULATORY_CARE_PROVIDER_SITE_OTHER): Payer: Medicaid Other | Admitting: Obstetrics and Gynecology

## 2013-08-03 VITALS — BP 111/65 | Temp 97.4°F | Wt 185.0 lb

## 2013-08-03 DIAGNOSIS — O09299 Supervision of pregnancy with other poor reproductive or obstetric history, unspecified trimester: Secondary | ICD-10-CM

## 2013-08-03 DIAGNOSIS — O34219 Maternal care for unspecified type scar from previous cesarean delivery: Secondary | ICD-10-CM

## 2013-08-03 DIAGNOSIS — Z3493 Encounter for supervision of normal pregnancy, unspecified, third trimester: Secondary | ICD-10-CM

## 2013-08-03 DIAGNOSIS — O09293 Supervision of pregnancy with other poor reproductive or obstetric history, third trimester: Secondary | ICD-10-CM

## 2013-08-03 DIAGNOSIS — N83209 Unspecified ovarian cyst, unspecified side: Secondary | ICD-10-CM

## 2013-08-03 LAB — POCT URINALYSIS DIP (DEVICE)
Ketones, ur: NEGATIVE mg/dL
Protein, ur: NEGATIVE mg/dL
Specific Gravity, Urine: 1.015 (ref 1.005–1.030)
Urobilinogen, UA: 0.2 mg/dL (ref 0.0–1.0)
pH: 7 (ref 5.0–8.0)

## 2013-08-03 NOTE — Progress Notes (Signed)
Patient doing well. Reports increased pelvic pressure. FM/labor precautions reviewed

## 2013-08-03 NOTE — Progress Notes (Signed)
Pulse 78 C/o pain and pressure in pelvic area especially when ambulating.

## 2013-08-06 ENCOUNTER — Encounter: Payer: Self-pay | Admitting: Obstetrics & Gynecology

## 2013-08-11 ENCOUNTER — Encounter (HOSPITAL_COMMUNITY): Payer: Self-pay | Admitting: *Deleted

## 2013-08-11 ENCOUNTER — Inpatient Hospital Stay (HOSPITAL_COMMUNITY)
Admission: AD | Admit: 2013-08-11 | Discharge: 2013-08-13 | DRG: 775 | Disposition: A | Discharge status: Home / Self Care | Payer: Medicaid Other | Source: Ambulatory Visit | Attending: Obstetrics and Gynecology | Admitting: Obstetrics and Gynecology

## 2013-08-11 ENCOUNTER — Encounter (HOSPITAL_COMMUNITY): Payer: Self-pay | Admitting: Anesthesiology

## 2013-08-11 ENCOUNTER — Inpatient Hospital Stay (HOSPITAL_COMMUNITY): Payer: Medicaid Other | Admitting: Anesthesiology

## 2013-08-11 DIAGNOSIS — IMO0001 Reserved for inherently not codable concepts without codable children: Secondary | ICD-10-CM

## 2013-08-11 DIAGNOSIS — O34219 Maternal care for unspecified type scar from previous cesarean delivery: Secondary | ICD-10-CM | POA: Diagnosis present

## 2013-08-11 LAB — CBC
Hemoglobin: 11.5 g/dL — ABNORMAL LOW (ref 12.0–15.0)
MCHC: 32.2 g/dL (ref 30.0–36.0)
RDW: 15.6 % — ABNORMAL HIGH (ref 11.5–15.5)

## 2013-08-11 MED ORDER — ACETAMINOPHEN 325 MG PO TABS: 650.0000 mg | ORAL_TABLET | ORAL | Status: DC | PRN

## 2013-08-11 MED ORDER — EPHEDRINE 5 MG/ML INJ
10.0000 mg | INTRAVENOUS | Status: DC | PRN
  Filled 2013-08-11: qty 4
  Filled 2013-08-11: qty 2

## 2013-08-11 MED ORDER — EPHEDRINE 5 MG/ML INJ
10.0000 mg | INTRAVENOUS | Status: DC | PRN
  Filled 2013-08-11: qty 2

## 2013-08-11 MED ORDER — LACTATED RINGERS IV SOLN: 500.0000 mL | Freq: Once | INTRAVENOUS | Status: DC

## 2013-08-11 MED ORDER — PHENYLEPHRINE 40 MCG/ML (10ML) SYRINGE FOR IV PUSH (FOR BLOOD PRESSURE SUPPORT)
80.0000 ug | PREFILLED_SYRINGE | INTRAVENOUS | Status: DC | PRN
  Filled 2013-08-11: qty 2

## 2013-08-11 MED ORDER — OXYTOCIN BOLUS FROM INFUSION: 500.0000 mL | INTRAVENOUS | Status: DC

## 2013-08-11 MED ORDER — LACTATED RINGERS IV SOLN
INTRAVENOUS | Status: DC
  Administered 2013-08-11: 17:00:00 via INTRAVENOUS

## 2013-08-11 MED ORDER — LACTATED RINGERS IV SOLN
INTRAVENOUS | Status: DC
  Administered 2013-08-12: 03:00:00 via INTRAVENOUS

## 2013-08-11 MED ORDER — LACTATED RINGERS IV SOLN: 500.0000 mL | INTRAVENOUS | Status: DC | PRN

## 2013-08-11 MED ORDER — IBUPROFEN 600 MG PO TABS: 600.0000 mg | ORAL_TABLET | Freq: Four times a day (QID) | ORAL | Status: DC | PRN

## 2013-08-11 MED ORDER — LIDOCAINE HCL (PF) 1 % IJ SOLN
INTRAMUSCULAR | Status: DC | PRN
  Administered 2013-08-11 (×2): 5 mL

## 2013-08-11 MED ORDER — OXYTOCIN 40 UNITS IN LACTATED RINGERS INFUSION - SIMPLE MED
62.5000 mL/h | INTRAVENOUS | Status: DC
  Filled 2013-08-11: qty 1000

## 2013-08-11 MED ORDER — FENTANYL CITRATE 0.05 MG/ML IJ SOLN
100.0000 ug | INTRAMUSCULAR | Status: DC | PRN
  Administered 2013-08-11 (×2): 100 ug via INTRAVENOUS
  Filled 2013-08-11 (×2): qty 2

## 2013-08-11 MED ORDER — DIPHENHYDRAMINE HCL 50 MG/ML IJ SOLN: 12.5000 mg | INTRAMUSCULAR | Status: DC | PRN

## 2013-08-11 MED ORDER — FENTANYL CITRATE 0.05 MG/ML IJ SOLN
100.0000 ug | Freq: Once | INTRAMUSCULAR | Status: AC
  Administered 2013-08-11: 100 ug via INTRAVENOUS
  Filled 2013-08-11: qty 2

## 2013-08-11 MED ORDER — FENTANYL 2.5 MCG/ML BUPIVACAINE 1/10 % EPIDURAL INFUSION (WH - ANES)
14.0000 mL/h | INTRAMUSCULAR | Status: DC | PRN
  Administered 2013-08-11: 14 mL/h via EPIDURAL
  Filled 2013-08-11: qty 125

## 2013-08-11 MED ORDER — FLEET ENEMA 7-19 GM/118ML RE ENEM: 1.0000 | ENEMA | RECTAL | Status: DC | PRN

## 2013-08-11 MED ORDER — PHENYLEPHRINE 40 MCG/ML (10ML) SYRINGE FOR IV PUSH (FOR BLOOD PRESSURE SUPPORT)
80.0000 ug | PREFILLED_SYRINGE | INTRAVENOUS | Status: DC | PRN
  Filled 2013-08-11: qty 2
  Filled 2013-08-11: qty 5

## 2013-08-11 MED ORDER — LIDOCAINE HCL (PF) 1 % IJ SOLN
30.0000 mL | INTRAMUSCULAR | Status: DC | PRN
  Filled 2013-08-11 (×2): qty 30

## 2013-08-11 MED ORDER — OXYCODONE-ACETAMINOPHEN 5-325 MG PO TABS: 1.0000 | ORAL_TABLET | ORAL | Status: DC | PRN

## 2013-08-11 MED ORDER — ONDANSETRON HCL 4 MG/2ML IJ SOLN
4.0000 mg | Freq: Four times a day (QID) | INTRAMUSCULAR | Status: DC | PRN
  Administered 2013-08-11: 4 mg via INTRAVENOUS
  Filled 2013-08-11: qty 2

## 2013-08-11 MED ORDER — CITRIC ACID-SODIUM CITRATE 334-500 MG/5ML PO SOLN
30.0000 mL | ORAL | Status: DC | PRN
  Administered 2013-08-12: 30 mL via ORAL
  Filled 2013-08-11: qty 15

## 2013-08-11 NOTE — H&P (Signed)
Sheena Hernandez is a 32 y.o. female presenting for contractions.  [facility].  Pregnancy dated by LMP, consistent with 18 wk ultrasound.  Pregnancy significant for 2nd child delivered by csection for macrosomia.  Maternal Medical History:  Reason for admission: Contractions.   Contractions: Onset was 6-12 hours ago.   Frequency: regular.    Fetal activity: Perceived fetal activity is normal.   Last perceived fetal movement was within the past hour.      OB History   Grav Para Term Preterm Abortions TAB SAB Ect Mult Living   4 2 2  1  1   2      Past Medical History  Diagnosis Date  . No pertinent past medical history   . Ovarian cyst in pregnancy 08/2011    Resolved    Past Surgical History  Procedure Laterality Date  . Cesarean section    . Laparoscopy  08/18/2011    Procedure: LAPAROSCOPY OPERATIVE;  Surgeon: Reva Bores, MD;  Location: [facility];  Service: Gynecology;  Laterality: N/A;   Family History: family history is not on file. Social History:  reports that she has never smoked. She has never used smokeless tobacco. She reports that she does not drink alcohol or use illicit drugs.   Prenatal Transfer Tool  Maternal Diabetes: No Genetic Screening: Normal Maternal Ultrasounds/Referrals: Normal Fetal Ultrasounds or other Referrals:  None Maternal Substance Abuse:  No Significant Maternal Medications:  None Significant Maternal Lab Results:  Lab values include: Group B Strep negative Other Comments:  None  Review of Systems  Gastrointestinal: Positive for abdominal pain (contractions).  All other systems reviewed and are negative.    Dilation: 7 Effacement (%): 90 Station: -1 Exam by:: LCarpenter,RN Blood pressure 127/94, pulse 68, temperature 97.9 F (36.6 C), temperature source Oral, resp. rate 20, height 5' 6.5" (1.689 m), weight 85.39 kg (188 lb 4 oz), last menstrual period 11/08/2012, not currently breastfeeding. Maternal  Exam:  Uterine Assessment: Contraction strength is firm.  Abdomen: Surgical scars: low transverse.   Estimated fetal weight is 7.5-8lbs.   Fetal presentation: vertex  Introitus: Normal vulva. Normal vagina.  Vaginal discharge: mucusy.    Fetal Exam Fetal Monitor Review: Baseline rate: 140's.  Pattern: accelerations present.    Fetal State Assessment: Category I - tracings are normal.     Physical Exam  Constitutional: She is oriented to person, place, and time. She appears well-developed and well-nourished. She appears distressed (appears uncomfortable).  HENT:  Head: Normocephalic.  Neck: Normal range of motion. Neck supple.  Cardiovascular: Normal rate, regular rhythm and normal heart sounds.   Respiratory: Effort normal and breath sounds normal.  GI: Soft. There is no tenderness.  Genitourinary: No bleeding around the vagina. Vaginal discharge: mucusy.  Musculoskeletal: Normal range of motion. She exhibits edema (trace pedal).  Neurological: She is alert and oriented to person, place, and time.  Skin: Skin is warm and dry.    Prenatal labs: ABO, Rh: O/POS/-- (02/12 1030) Antibody: NEG (02/12 1030) Rubella: 5.91 (02/12 1030) RPR: NON REAC (08/06 1400)  HBsAg: NEGATIVE (02/12 1030)  HIV: NON REACTIVE (08/06 1400)  GBS: Negative (08/06 0000)   Assessment/Plan: 32 yo G4P2012 at 39.3 wks IUP Active Labor TOLAC GBS Neg  Plan: Admit to Birthing Suites Anticipate NSVD   [facility] 08/11/2013, 8:53 PM

## 2013-08-11 NOTE — Anesthesia Preprocedure Evaluation (Signed)

## 2013-08-11 NOTE — Anesthesia Procedure Notes (Signed)
Epidural Patient location during procedure: OB Start time: 08/11/2013 11:28 PM  Staffing Anesthesiologist: Angus Seller., Harrell Gave. Performed by: anesthesiologist   Preanesthetic Checklist Completed: patient identified, site marked, surgical consent, pre-op evaluation, timeout performed, IV checked, risks and benefits discussed and monitors and equipment checked  Epidural Patient position: sitting Prep: site prepped and draped and DuraPrep Patient monitoring: continuous pulse ox and blood pressure Approach: midline Injection technique: LOR air and LOR saline  Needle:  Needle type: Tuohy  Needle gauge: 17 G Needle length: 9 cm and 9 Needle insertion depth: 5 cm cm Catheter type: closed end flexible Catheter size: 19 Gauge Catheter at skin depth: 10 cm Test dose: negative  Assessment Events: blood not aspirated, injection not painful, no injection resistance, negative IV test and no paresthesia  Additional Notes Patient identified.  Risk benefits discussed including failed block, incomplete pain control, headache, nerve damage, paralysis, blood pressure changes, nausea, vomiting, reactions to medication both toxic or allergic, and postpartum back pain.  Patient expressed understanding and wished to proceed.  All questions were answered.  Sterile technique used throughout procedure and epidural site dressed with sterile barrier dressing. No paresthesia or other complications noted.The patient did not experience any signs of intravascular injection such as tinnitus or metallic taste in mouth nor signs of intrathecal spread such as rapid motor block. Please see nursing notes for vital signs.

## 2013-08-11 NOTE — MAU Note (Signed)
Pt reports having ctx on and off for the past 3 days got worse this afternoon. Denies SROM or bleeding and reports good fetal movement. 1cm in office last week

## 2013-08-12 ENCOUNTER — Encounter (HOSPITAL_COMMUNITY): Payer: Self-pay | Admitting: *Deleted

## 2013-08-12 DIAGNOSIS — O34219 Maternal care for unspecified type scar from previous cesarean delivery: Secondary | ICD-10-CM

## 2013-08-12 LAB — RPR: RPR Ser Ql: NONREACTIVE

## 2013-08-12 LAB — TYPE AND SCREEN
ABO/RH(D): O POS
Antibody Screen: NEGATIVE

## 2013-08-12 MED ORDER — ONDANSETRON HCL 4 MG PO TABS: 4.0000 mg | ORAL_TABLET | ORAL | Status: DC | PRN

## 2013-08-12 MED ORDER — OXYTOCIN 40 UNITS IN LACTATED RINGERS INFUSION - SIMPLE MED
1.0000 m[IU]/min | INTRAVENOUS | Status: DC
  Administered 2013-08-12: 1 m[IU]/min via INTRAVENOUS

## 2013-08-12 MED ORDER — BENZOCAINE-MENTHOL 20-0.5 % EX AERO
1.0000 "application " | INHALATION_SPRAY | CUTANEOUS | Status: DC | PRN
  Administered 2013-08-12: 1 via TOPICAL
  Filled 2013-08-12: qty 56

## 2013-08-12 MED ORDER — SENNOSIDES-DOCUSATE SODIUM 8.6-50 MG PO TABS
2.0000 | ORAL_TABLET | Freq: Every day | ORAL | Status: DC
  Administered 2013-08-12: 2 via ORAL

## 2013-08-12 MED ORDER — DIPHENHYDRAMINE HCL 25 MG PO CAPS: 25.0000 mg | ORAL_CAPSULE | Freq: Four times a day (QID) | ORAL | Status: DC | PRN

## 2013-08-12 MED ORDER — LANOLIN HYDROUS EX OINT: TOPICAL_OINTMENT | CUTANEOUS | Status: DC | PRN

## 2013-08-12 MED ORDER — OXYCODONE-ACETAMINOPHEN 5-325 MG PO TABS
1.0000 | ORAL_TABLET | ORAL | Status: DC | PRN
  Administered 2013-08-12 (×2): 1 via ORAL
  Filled 2013-08-12 (×2): qty 1

## 2013-08-12 MED ORDER — ZOLPIDEM TARTRATE 5 MG PO TABS: 5.0000 mg | ORAL_TABLET | Freq: Every evening | ORAL | Status: DC | PRN

## 2013-08-12 MED ORDER — ONDANSETRON HCL 4 MG/2ML IJ SOLN: 4.0000 mg | INTRAMUSCULAR | Status: DC | PRN

## 2013-08-12 MED ORDER — PRENATAL MULTIVITAMIN CH
1.0000 | ORAL_TABLET | Freq: Every day | ORAL | Status: DC
  Administered 2013-08-12: 1 via ORAL
  Filled 2013-08-12 (×2): qty 1

## 2013-08-12 MED ORDER — WITCH HAZEL-GLYCERIN EX PADS: 1.0000 "application " | MEDICATED_PAD | CUTANEOUS | Status: DC | PRN

## 2013-08-12 MED ORDER — TERBUTALINE SULFATE 1 MG/ML IJ SOLN: 0.2500 mg | Freq: Once | INTRAMUSCULAR | Status: DC | PRN

## 2013-08-12 MED ORDER — DIBUCAINE 1 % RE OINT: 1.0000 "application " | TOPICAL_OINTMENT | RECTAL | Status: DC | PRN

## 2013-08-12 MED ORDER — TETANUS-DIPHTH-ACELL PERTUSSIS 5-2.5-18.5 LF-MCG/0.5 IM SUSP: 0.5000 mL | Freq: Once | INTRAMUSCULAR | Status: DC

## 2013-08-12 MED ORDER — IBUPROFEN 600 MG PO TABS
600.0000 mg | ORAL_TABLET | Freq: Four times a day (QID) | ORAL | Status: DC
  Administered 2013-08-12 – 2013-08-13 (×6): 600 mg via ORAL
  Filled 2013-08-12 (×6): qty 1

## 2013-08-12 MED ORDER — SIMETHICONE 80 MG PO CHEW: 80.0000 mg | CHEWABLE_TABLET | ORAL | Status: DC | PRN

## 2013-08-12 NOTE — H&P (Signed)
Attestation of Attending Supervision of Advanced Practitioner (CNM/NP): Evaluation and management procedures were performed by the Advanced Practitioner under my supervision and collaboration.  I have reviewed the Advanced Practitioner's note and chart, and I agree with the management and plan.  Kelso Bibby 08/12/2013 7:36 AM   

## 2013-08-12 NOTE — Progress Notes (Signed)
Bay A Grassel is a 32 y.o. [id number] at [redacted]w[redacted]d admitted for spontaneous onset of labor  Subjective: Now comfortable with epidural  Objective: BP 101/62  Pulse 77  Temp(Src) 97.9 F (36.6 C) (Oral)  Resp 18  Ht 5' 6.5" (1.689 m)  Wt 188 lb 4 oz (85.39 kg)  BMI 29.93 kg/m2  SpO2 100%  LMP 11/08/2012  Breastfeeding? No      FHT:  FHR: 140s bpm, variability: moderate,  accelerations:  Present,  decelerations:  Present one shallow variable decel UC:   irregular, every 4-10 minutes SVE:   Dilation: Lip/rim Effacement (%): 90 Station: -1 Exam by:: Pollie Meyer, MD  Labs: Lab Results  Component Value Date   WBC 6.2 08/11/2013   HGB 11.5* 08/11/2013   HCT 35.7* 08/11/2013   MCV 79.7 08/11/2013   PLT 279 08/11/2013    Assessment / Plan: Spontaneous onset of labor, Protracted active phase  Labor: will add low dose pitocin for labor augmentation Preeclampsia:  n/a Fetal Wellbeing:  Category I Pain Control:  Epidural I/D:  gbs negative Anticipated MOD:  NSVD  Levert Feinstein 08/12/2013, 12:58 AM

## 2013-08-13 MED ORDER — IBUPROFEN 200 MG PO TABS: 600.0000 mg | ORAL_TABLET | Freq: Four times a day (QID) | ORAL | Status: DC | PRN

## 2013-08-13 NOTE — Discharge Summary (Cosign Needed)
Obstetric Discharge Summary Reason for Admission: onset of labor Prenatal Procedures: none Intrapartum Procedures: spontaneous vaginal delivery and VBAC Postpartum Procedures: none Complications-Operative and Postpartum: 2nd degree perineal laceration Hemoglobin  Date Value Range Status  08/11/2013 11.5* 12.0 - 15.0 g/dL Final     HCT  Date Value Range Status  08/11/2013 35.7* 36.0 - 46.0 % Final    Physical Exam:  General: alert, cooperative, appears stated age and no distress Lochia: appropriate Uterine Fundus: firm U-2 CTAB no w/r/c RRR no mg/t DVT Evaluation: No evidence of DVT seen on physical exam. Negative Homan's sign. No cords or calf tenderness. No significant calf/ankle edema.  Discharge Diagnoses: Term Pregnancy-delivered and VBAC  Discharge Information: Date: 08/13/2013 Activity: pelvic rest Diet: routine Medications: Ibuprofen Condition: stable Instructions: refer to practice specific booklet Discharge to: home Follow-up Information   Follow up with [facility] In 4 weeks.   Specialty:  Obstetrics and Gynecology   Contact information:   [address] [place] [address] [phone]    MOF BReast MOC Undecided  Newborn Data: Live born female  Birth Weight: 7 lb 15.2 oz (3605 g) APGAR: 8, 9  Home with mother.  Tawana Scale 08/13/2013, 7:51 AM

## 2013-08-13 NOTE — Progress Notes (Signed)
UR chart review completed.  

## 2013-08-13 NOTE — Anesthesia Postprocedure Evaluation (Signed)
Anesthesia Post Note  Patient: @Sheena Hernandez @[facility]   Procedure(s) Performed: CLE/C/S  Anesthesia type: Epidural  Patient location: Mother/Baby  Post pain: Pain level controlled  Post assessment: Post-op Vital signs reviewed  Last Vitals: BP 90/52  Pulse 98  Temp(Src) 36.7 C (Oral)  Resp 61  Ht 5' 6.5" (1.689 m)  Wt 188 lb 4 oz (85.39 kg)  BMI 29.93 kg/m2  SpO2 97%  LMP 11/08/2012  Breastfeeding? No  Post vital signs: Reviewed  Level of consciousness: awake  Complications: No apparent anesthesia complications

## 2013-08-14 ENCOUNTER — Encounter: Payer: Self-pay | Admitting: Obstetrics & Gynecology

## 2013-08-21 ENCOUNTER — Encounter: Payer: Self-pay | Admitting: Family Medicine

## 2013-08-29 ENCOUNTER — Encounter: Payer: Self-pay | Admitting: *Deleted

## 2013-09-19 ENCOUNTER — Ambulatory Visit: Payer: Self-pay | Admitting: Family Medicine

## 2013-10-26 ENCOUNTER — Encounter: Payer: Self-pay | Admitting: Family Medicine

## 2013-10-26 ENCOUNTER — Ambulatory Visit (INDEPENDENT_AMBULATORY_CARE_PROVIDER_SITE_OTHER): Payer: Medicaid Other | Admitting: Family Medicine

## 2013-10-26 VITALS — BP 112/68 | HR 68 | Temp 97.1°F | Wt 172.1 lb

## 2013-10-26 DIAGNOSIS — Z309 Encounter for contraceptive management, unspecified: Secondary | ICD-10-CM

## 2013-10-26 LAB — POCT PREGNANCY, URINE: Preg Test, Ur: NEGATIVE

## 2013-10-26 NOTE — Progress Notes (Signed)
Subjective:    Sheena Hernandez is a 32 y.o. [id number] African American female who presents for a postpartum visit. She is 10 weeks postpartum following a VBAC. I have fully reviewed the prenatal and intrapartum course. The delivery was at 39 gestational weeks.  Anesthesia: epidural. Postpartum course has been uncomplicated. Baby's course has been good- baby already 16 lbs. Baby is feeding by breast. Bleeding no bleeding. Bowel function is normal. Bladder function is normal. Patient is not sexually active. Contraception method is undecided. Postpartum depression screening: negative.  The following portions of the patient's history were reviewed and updated as appropriate: allergies, current medications, past medical history, past surgical history and problem list.  Review of Systems Pertinent items are noted in HPI.   Filed Vitals:   10/26/13 1117  BP: 112/68  Pulse: 68  Temp: 97.1 F (36.2 C)  Weight: 172 lb 1.6 oz (78.064 kg)    Objective:     General:  alert, cooperative and no distress   Breasts:  deferred, no complaints  Lungs: clear to auscultation bilaterally  Heart:  regular rate and rhythm  Abdomen: soft, nontender   Vulva: normal  Vagina: normal vagina  Cervix:  closed  Corpus: Well-involuted  Adnexa:  Non-palpable  Rectal Exam: no hemorrhoids        Assessment:   normal postpartum exam No pap done in pregnancy 10 wks s/p SVD Depression screening Contraception counseling   Plan:   Contraception: pt still undecided. here with husband. they will use condoms and spermicide for now but counseling was provided and she will f/u in 1 month for contraception  PAP done today as was missed in pregnancy  Follow up in: 1 month for contraception or as needed.

## 2013-10-26 NOTE — Patient Instructions (Signed)
Contraception Choices Contraception (birth control) is the use of any methods or devices to prevent pregnancy. Below are some methods to help avoid pregnancy. HORMONAL METHODS   Contraceptive implant This is a thin, plastic tube containing progesterone hormone. It does not contain estrogen hormone. Your health care provider inserts the tube in the inner part of the upper arm. The tube can remain in place for up to 3 years. After 3 years, the implant must be removed. The implant prevents the ovaries from releasing an egg (ovulation), thickens the cervical mucus to prevent sperm from entering the uterus, and thins the lining of the inside of the uterus.  Progesterone-only injections These injections are given every 3 months by your health care provider to prevent pregnancy. This synthetic progesterone hormone stops the ovaries from releasing eggs. It also thickens cervical mucus and changes the uterine lining. This makes it harder for sperm to survive in the uterus.  Birth control pills These pills contain estrogen and progesterone hormone. They work by preventing the ovaries from releasing eggs (ovulation). They also cause the cervical mucus to thicken, preventing the sperm from entering the uterus. Birth control pills are prescribed by a health care provider.Birth control pills can also be used to treat heavy periods.  Minipill This type of birth control pill contains only the progesterone hormone. They are taken every day of each month and must be prescribed by your health care provider.  Birth control patch The patch contains hormones similar to those in birth control pills. It must be changed once a week and is prescribed by a health care provider.  Vaginal ring The ring contains hormones similar to those in birth control pills. It is left in the vagina for 3 weeks, removed for 1 week, and then a new one is put back in place. The patient must be comfortable inserting and removing the ring from the  vagina.A health care provider's prescription is necessary.  Emergency contraception Emergency contraceptives prevent pregnancy after unprotected sexual intercourse. This pill can be taken right after sex or up to 5 days after unprotected sex. It is most effective the sooner you take the pills after having sexual intercourse. Most emergency contraceptive pills are available without a prescription. Check with your pharmacist. Do not use emergency contraception as your only form of birth control. BARRIER METHODS   Female condom This is a thin sheath (latex or rubber) that is worn over the penis during sexual intercourse. It can be used with spermicide to increase effectiveness.  Female condom. This is a soft, loose-fitting sheath that is put into the vagina before sexual intercourse.  Diaphragm This is a soft, latex, dome-shaped barrier that must be fitted by a health care provider. It is inserted into the vagina, along with a spermicidal jelly. It is inserted before intercourse. The diaphragm should be left in the vagina for 6 to 8 hours after intercourse.  Cervical cap This is a round, soft, latex or plastic cup that fits over the cervix and must be fitted by a health care provider. The cap can be left in place for up to 48 hours after intercourse.  Sponge This is a soft, circular piece of polyurethane foam. The sponge has spermicide in it. It is inserted into the vagina after wetting it and before sexual intercourse.  Spermicides These are chemicals that kill or block sperm from entering the cervix and uterus. They come in the form of creams, jellies, suppositories, foam, or tablets. They do not require a   prescription. They are inserted into the vagina with an applicator before having sexual intercourse. The process must be repeated every time you have sexual intercourse. INTRAUTERINE CONTRACEPTION  Intrauterine device (IUD) This is a T-shaped device that is put in a woman's uterus during a  menstrual period to prevent pregnancy. There are 2 types:  Copper IUD This type of IUD is wrapped in copper wire and is placed inside the uterus. Copper makes the uterus and fallopian tubes produce a fluid that kills sperm. It can stay in place for 10 years.  Hormone IUD This type of IUD contains the hormone progestin (synthetic progesterone). The hormone thickens the cervical mucus and prevents sperm from entering the uterus, and it also thins the uterine lining to prevent implantation of a fertilized egg. The hormone can weaken or kill the sperm that get into the uterus. It can stay in place for 3 5 years, depending on which type of IUD is used. PERMANENT METHODS OF CONTRACEPTION  Female tubal ligation This is when the woman's fallopian tubes are surgically sealed, tied, or blocked to prevent the egg from traveling to the uterus.  Hysteroscopic sterilization This involves placing a small coil or insert into each fallopian tube. Your doctor uses a technique called hysteroscopy to do the procedure. The device causes scar tissue to form. This results in permanent blockage of the fallopian tubes, so the sperm cannot fertilize the egg. It takes about 3 months after the procedure for the tubes to become blocked. You must use another form of birth control for these 3 months.  Female sterilization This is when the female has the tubes that carry sperm tied off (vasectomy).This blocks sperm from entering the vagina during sexual intercourse. After the procedure, the man can still ejaculate fluid (semen). NATURAL PLANNING METHODS  Natural family planning This is not having sexual intercourse or using a barrier method (condom, diaphragm, cervical cap) on days the woman could become pregnant.  Calendar method This is keeping track of the length of each menstrual cycle and identifying when you are fertile.  Ovulation method This is avoiding sexual intercourse during ovulation.  Symptothermal method This is  avoiding sexual intercourse during ovulation, using a thermometer and ovulation symptoms.  Post ovulation method This is timing sexual intercourse after you have ovulated. Regardless of which type or method of contraception you choose, it is important that you use condoms to protect against the transmission of sexually transmitted infections (STIs). Talk with your health care provider about which form of contraception is most appropriate for you. Document Released: 11/29/2005 Document Revised: 08/01/2013 Document Reviewed: 05/24/2013 [employer] Patient Information 2014 [employer].  

## 2013-11-30 ENCOUNTER — Encounter: Payer: Self-pay | Admitting: Family Medicine

## 2013-11-30 ENCOUNTER — Ambulatory Visit (INDEPENDENT_AMBULATORY_CARE_PROVIDER_SITE_OTHER): Payer: Medicaid Other | Admitting: Family Medicine

## 2013-11-30 VITALS — BP 116/70 | HR 82 | Temp 97.3°F | Ht 63.0 in | Wt 172.9 lb

## 2013-11-30 DIAGNOSIS — Z309 Encounter for contraceptive management, unspecified: Secondary | ICD-10-CM

## 2013-11-30 NOTE — Patient Instructions (Signed)

## 2013-11-30 NOTE — Progress Notes (Signed)
Subjective:     Sheena Hernandez is a 32 y.o. female who presents for a postpartum visit. She is 3 months postpartum following a spontaneous vaginal delivery.  Baby is feeding by breast. Bleeding FDLMP November 17th, 3 day light menses. Bowel function is normal. Bladder function is normal. Patient is sexually active. Contraception method is none.   The following portions of the patient's history were reviewed and updated as appropriate: allergies, current medications, past family history, past medical history, past social history, past surgical history and problem list.  Review of Systems Pertinent items are noted in HPI.   Objective:    BP 116/70  Pulse 82  Temp(Src) 97.3 F (36.3 C) (Oral)  Ht 5\' 3"  (1.6 m)  Wt 172 lb 14.4 oz (78.427 kg)  BMI 30.64 kg/m2  Breastfeeding? Yes  General:  alert, cooperative and appears stated age  Lungs: clear to auscultation bilaterally  Heart:  regular rate and rhythm, S1, S2 normal, no murmur, click, rub or gallop  Abdomen: soft, non-tender; bowel sounds normal; no masses,  no organomegaly        Assessment:    Contraceptive Management Evaluation   Plan:    1. Contraception: .  Pt would not like to use contraceptive management at this time, maybe in about 3 months.  She has unprotected intercourse since her FDLMP of November 17th.  Recommend condom use. 2. Follow up in: 3 months or as needed.   Sheena First Athziry Millican, DO of [facility] 11/30/2013, 9:23 AM  I have seen and examined this patient and agree with above documentation in the resdient's note however pt did state to me she is using condoms so her intercourse is not unprotected.  Pt with regular medicaid. Encouraged choice on birth control but states she is actually using condoms regularly and is going to cont doing so. She will f/u in 3 months if she changes her mind.   Sheena Hernandez, M.D. [facility] Fellow 11/30/2013 11:49 AM

## 2014-05-26 IMAGING — US US OB DETAIL+14 WK
1 series · 12 of 28 positions shown · non-contrast
Comparison: none

[Series 1: us ob detail +14 wk · 12 of 70 slices shown]
[im 3/70]
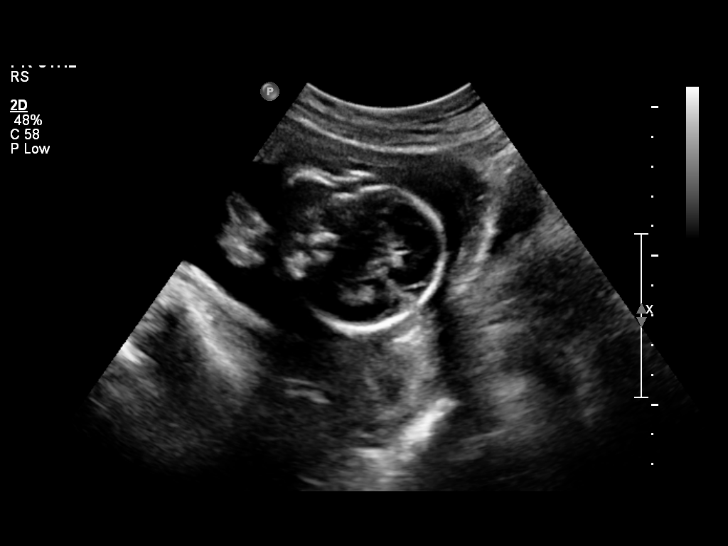
[im 8/70]
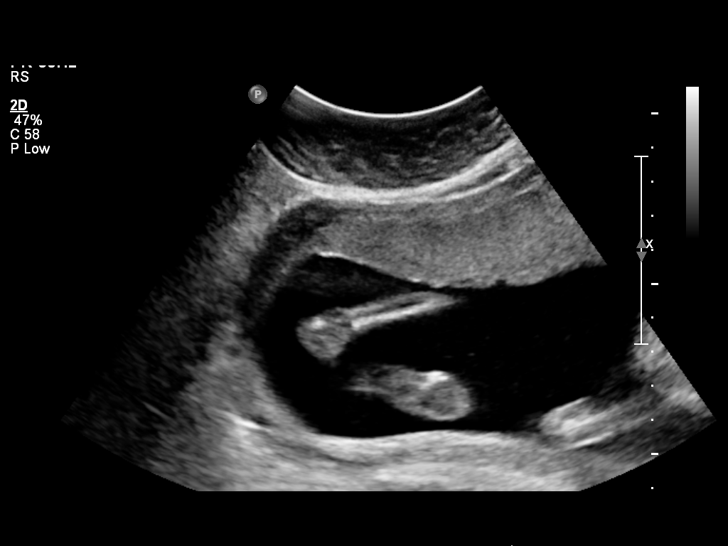
[im 13/70]
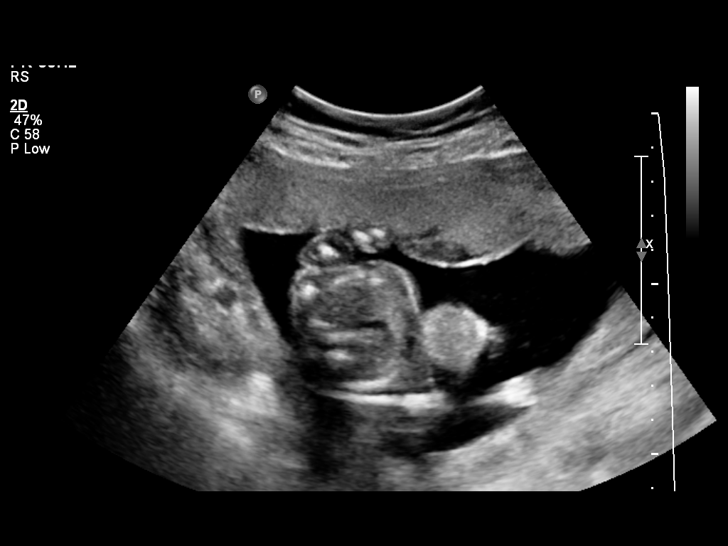
[im 21/70]
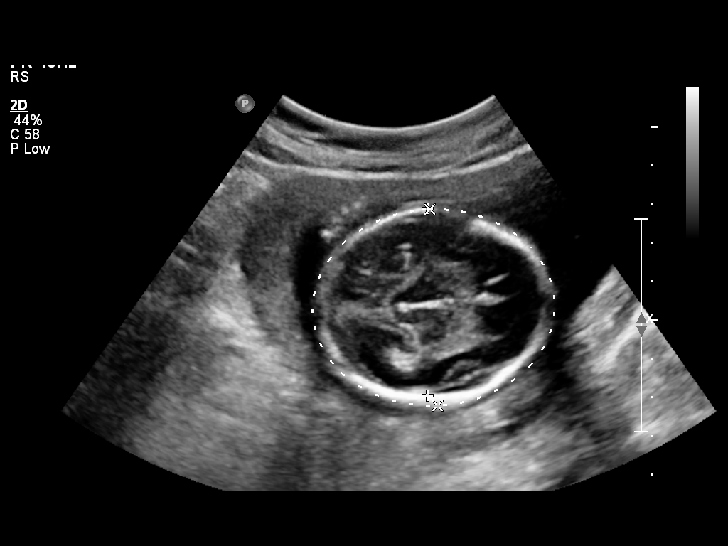
[im 26/70]
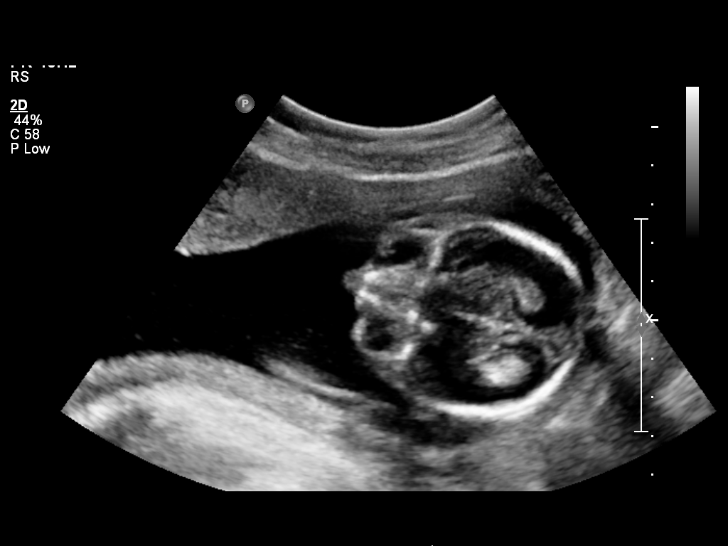
[im 31/70]
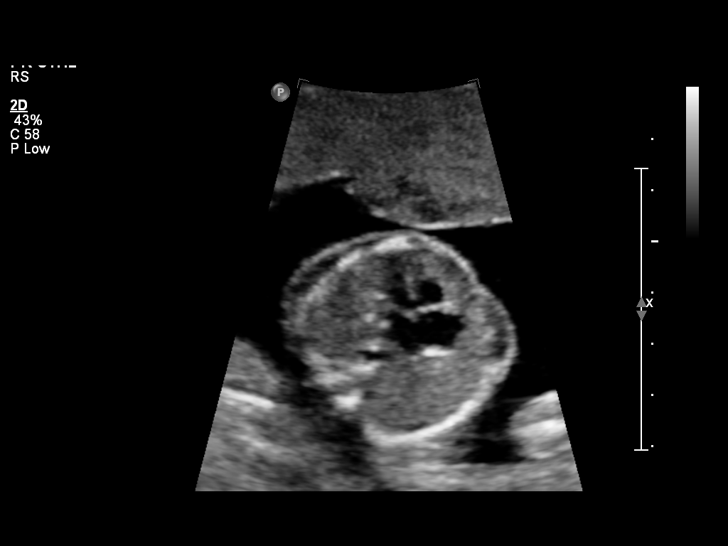
[im 39/70]
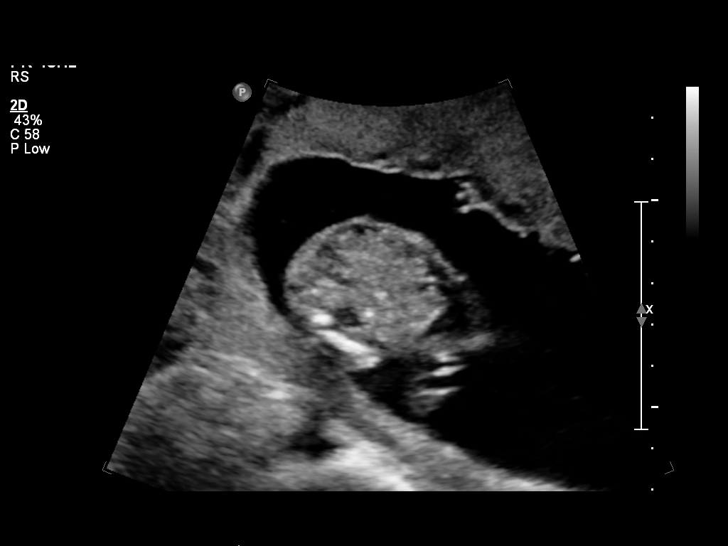
[im 44/70]
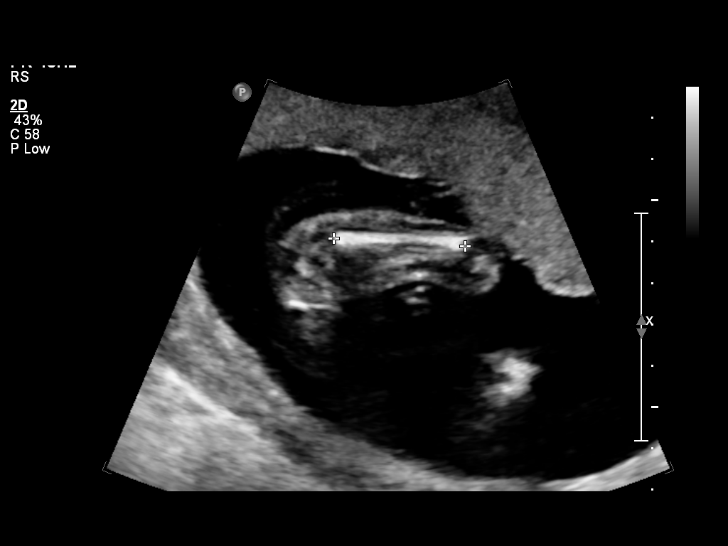
[im 49/70]
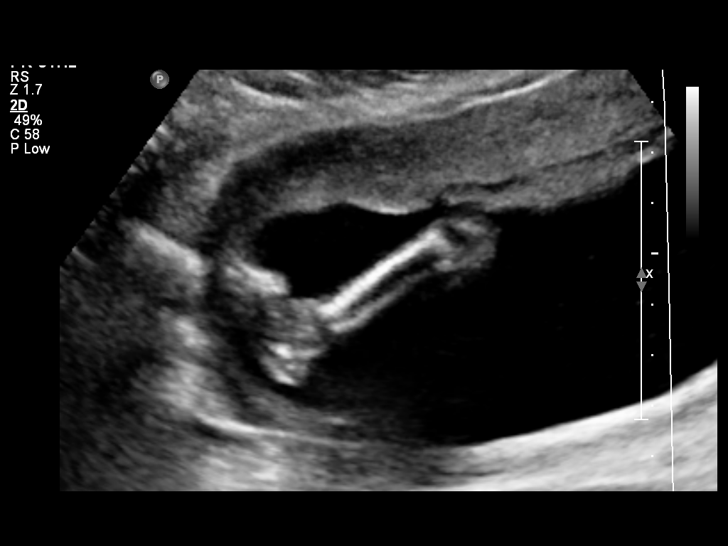
[im 57/70]
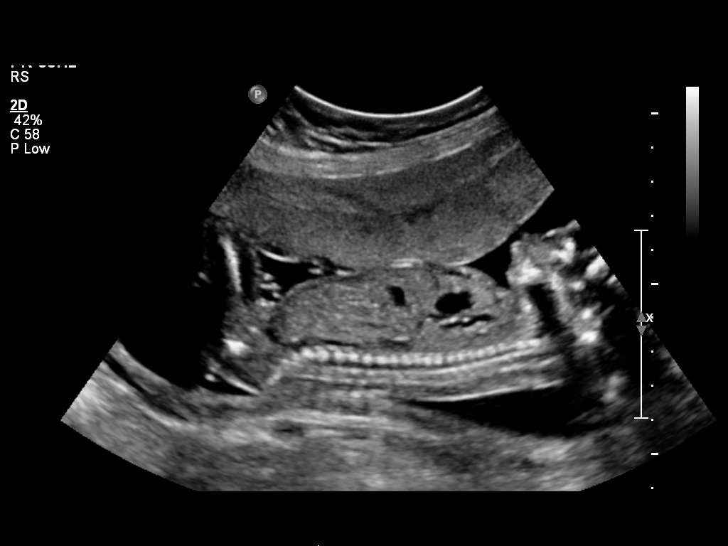
[im 62/70]
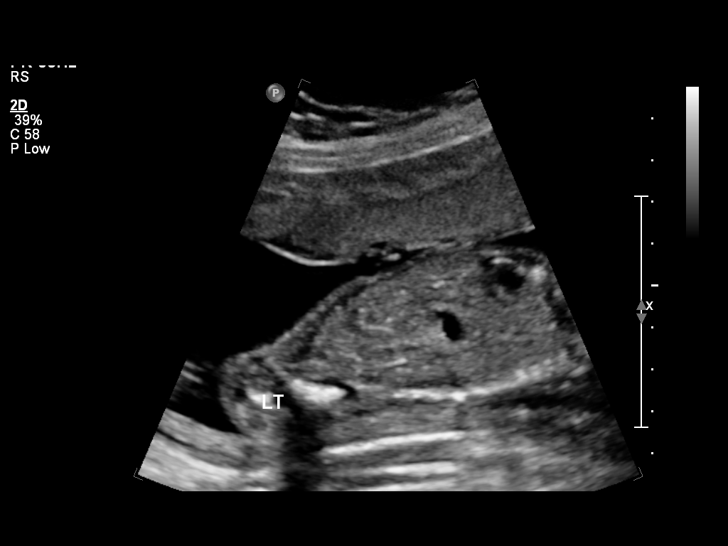
[im 67/70]
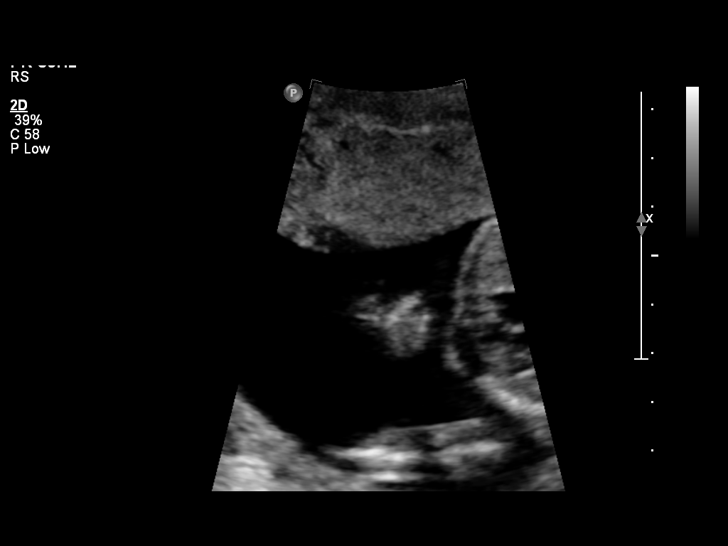

[12 of 28 positions shown; findings below may reference images not displayed]

OBSTETRICS REPORT
                      (Signed Final 03/19/2013 [DATE])

Service(s) Provided

 US OB DETAIL + 14 WK                                  76811.0
Indications

 Detailed fetal anatomic survey
 Prior c-section
Fetal Evaluation

 Num Of Fetuses:    1
 Fetal Heart Rate:  167                         bpm
 Cardiac Activity:  Observed
 Presentation:      Cephalic
 Placenta:          Anterior, above cervical os
 P. Cord            Visualized, central
 Insertion:

 Amniotic Fluid
 AFI FV:      Subjectively within normal limits
                                             Larg Pckt:   4.63   cm
 RLQ:   4.63   cm
Biometry

 BPD:     48.1  mm    G. Age:   20w 4d                CI:        74.63   70 - 86
                                                      FL/HC:      18.2   16.1 -

 HC:     176.7  mm    G. Age:   20w 1d       94  %    HC/AC:      1.28   1.09 -

 AC:     138.2  mm    G. Age:   19w 2d       64  %    FL/BPD:
 FL:      32.2  mm    G. Age:   20w 0d       86  %    FL/AC:      23.3   20 - 24
 HUM:     33.5  mm    G. Age:   21w 2d     > 95  %
 NFT:     3.13  mm

 Est. FW:     307  gm    0 lb 11 oz      60  %
Gestational Age

 LMP:           18w 5d       Date:   11/08/12                 EDD:   08/15/13
 U/S Today:     20w 0d                                        EDD:   08/06/13
 Best:          18w 5d    Det. By:   LMP  (11/08/12)          EDD:   08/15/13
Anatomy
 Cranium:          Appears normal         Aortic Arch:      Appears normal
 Fetal Cavum:      Appears normal         Ductal Arch:      Appears normal
 Ventricles:       Appears normal         Diaphragm:        Appears normal
 Choroid Plexus:   Appears normal         Stomach:          Appears normal, left
                                                            sided
 Cerebellum:       Appears normal         Abdomen:          Appears normal
 Posterior Fossa:  Appears normal         Abdominal Wall:   Appears nml (cord
                                                            insert, abd wall)
 Nuchal Fold:      Appears normal         Cord Vessels:     Appears normal (3
                                                            vessel cord)
 Face:             Appears normal         Kidneys:          Appear normal
                   (orbits and profile)
 Lips:             Appears normal         Bladder:          Appears normal
 Heart:            Appears normal         Spine:            Not well visualized
                   (4CH, axis, and
                   situs)
 RVOT:             Appears normal         Lower             Appears normal
                                          Extremities:
 LVOT:             Appears normal         Upper             Appears normal
                                          Extremities:

 Other:  Female gender. Heels and right 5th digit visualized.
Cervix Uterus Adnexa

 Cervical Length:   3.1       cm

 Cervix:       Normal appearance by transabdominal scan.
 Uterus:       No abnormality visualized.
 Left Ovary:   No adnexal mass visualized.
 Right Ovary:  No adnexal mass visualized.
 Adnexa:     No abnormality visualized.
Impression

 Single live IUP in cephalic presentation.  Concordant
 measurements/assigned GA by LMP.
 No anatomic abnormality seen with a good quality survey
 possible. Spine not well visualized due to fetal position.

## 2014-10-14 ENCOUNTER — Encounter: Payer: Self-pay | Admitting: Family Medicine

## 2014-12-26 ENCOUNTER — Encounter (HOSPITAL_COMMUNITY): Payer: Self-pay | Admitting: Family Medicine

## 2015-05-22 ENCOUNTER — Encounter (HOSPITAL_COMMUNITY): Payer: Self-pay | Admitting: Family Medicine

## 2016-07-23 ENCOUNTER — Inpatient Hospital Stay (HOSPITAL_COMMUNITY)
Admission: AD | Admit: 2016-07-23 | Discharge: 2016-07-23 | Disposition: A | Discharge status: Home / Self Care | Payer: BLUE CROSS/BLUE SHIELD | Source: Ambulatory Visit | Attending: Family Medicine | Admitting: Family Medicine

## 2016-07-23 ENCOUNTER — Encounter (HOSPITAL_COMMUNITY): Payer: Self-pay | Admitting: *Deleted

## 2016-07-23 DIAGNOSIS — M25551 Pain in right hip: Secondary | ICD-10-CM

## 2016-07-23 DIAGNOSIS — M25559 Pain in unspecified hip: Secondary | ICD-10-CM | POA: Diagnosis present

## 2016-07-23 LAB — URINE MICROSCOPIC-ADD ON

## 2016-07-23 LAB — URINALYSIS, ROUTINE W REFLEX MICROSCOPIC
Bilirubin Urine: NEGATIVE
GLUCOSE, UA: NEGATIVE mg/dL
Ketones, ur: NEGATIVE mg/dL
LEUKOCYTES UA: NEGATIVE
NITRITE: NEGATIVE
PH: 6.5 (ref 5.0–8.0)
PROTEIN: NEGATIVE mg/dL
Specific Gravity, Urine: 1.01 (ref 1.005–1.030)

## 2016-07-23 LAB — POCT PREGNANCY, URINE: PREG TEST UR: NEGATIVE

## 2016-07-23 MED ORDER — CYCLOBENZAPRINE HCL 10 MG PO TABS
10.0000 mg | ORAL_TABLET | Freq: Once | ORAL | Status: AC
  Administered 2016-07-23: 10 mg via ORAL
  Filled 2016-07-23: qty 1

## 2016-07-23 MED ORDER — IBUPROFEN 600 MG PO TABS: 600.0000 mg | ORAL_TABLET | Freq: Once | ORAL | Status: DC

## 2016-07-23 MED ORDER — KETOROLAC TROMETHAMINE 60 MG/2ML IM SOLN
60.0000 mg | Freq: Once | INTRAMUSCULAR | Status: AC
  Administered 2016-07-23: 60 mg via INTRAMUSCULAR
  Filled 2016-07-23: qty 2

## 2016-07-23 MED ORDER — CYCLOBENZAPRINE HCL 10 MG PO TABS: 10.0000 mg | ORAL_TABLET | Freq: Two times a day (BID) | ORAL | 0 refills | Status: DC | PRN

## 2016-07-23 NOTE — MAU Note (Signed)
Pt is having a sharp pain that comes and goes in her right hip.  Pt states this pain started about 5 days ago.

## 2016-07-23 NOTE — Discharge Instructions (Signed)
Heat Therapy °Heat therapy can help ease sore, stiff, injured, and tight muscles and joints. Heat relaxes your muscles, which may help ease your pain. Heat therapy should only be used on old, pre-existing, or long-lasting (chronic) injuries. Do not use heat therapy unless told by your doctor. °HOW TO USE HEAT THERAPY °There are several different kinds of heat therapy, including: °· Moist heat pack. °· Warm water bath. °· Hot water bottle. °· Electric heating pad. °· Heated gel pack. °· Heated wrap. °· Electric heating pad. °GENERAL HEAT THERAPY RECOMMENDATIONS  °· Do not sleep while using heat therapy. Only use heat therapy while you are awake. °· Your skin may turn pink while using heat therapy. Do not use heat therapy if your skin turns red. °· Do not use heat therapy if you have new pain. °· High heat or long exposure to heat can cause burns. Be careful when using heat therapy to avoid burning your skin. °· Do not use heat therapy on areas of your skin that are already irritated, such as with a rash or sunburn. °GET HELP IF:  °· You have blisters, redness, swelling (puffiness), or numbness. °· You have new pain. °· Your pain is worse. °MAKE SURE YOU: °· Understand these instructions. °· Will watch your condition. °· Will get help right away if you are not doing well or get worse. °  °This information is not intended to replace advice given to you by your health care provider. Make sure you discuss any questions you have with your health care provider. °  °Document Released: 02/21/2012 Document Revised: 12/20/2014 Document Reviewed: 01/22/2014 °Elsevier Interactive Patient Education ©2016 Elsevier Inc. ° °

## 2016-07-23 NOTE — MAU Provider Note (Signed)
History     CSN: [id number]  Arrival date and time: 07/23/16 [id number]   First Provider Initiated Contact with Patient 07/23/16 1012      Chief Complaint  Patient presents with  . Hip Pain   HPI   [name] is a 35 y.o. female [phone] here in the MAU for right hip pain. The pain started 1 week ago. The pain occurs every day. She has never been seen by a Dr. For this pain. The pain worsens when she walks. She has had no injury to her hip; denies a fall. She does not hear a popping noise in her hip when she walks.  The pain starts at the top of her right hip and shoots down her right leg to her knee. She has tried taking ibuprofen which helped only minimally.   The pain is keeping her up at night. The last time she took ibuprofen was yesterday morning. She took one pill.   When she walks she feels the pain more. She also notices that the area is swollen. She denies fever.   OB History    Gravida Para Term Preterm AB Living   4 3 3   1 3    SAB TAB Ectopic Multiple Live Births   1       3      Past Medical History:  Diagnosis Date  . No pertinent past medical history   . Ovarian cyst in pregnancy 08/2011   Resolved     Past Surgical History:  Procedure Laterality Date  . CESAREAN SECTION      History reviewed. No pertinent family history.  Social History  Substance Use Topics  . Smoking status: Never Smoker  . Smokeless tobacco: Never Used  . Alcohol use No    Allergies:  Allergies  Allergen Reactions  . Meat Extract Hives and Itching    Red meat only     Prescriptions Prior to Admission  Medication Sig Dispense Refill Last Dose  . ibuprofen (ADVIL) 200 MG tablet Take 3 tablets (600 mg total) by mouth every 6 (six) hours as needed for pain. 90 tablet 0 Not Taking  . Prenatal Vit-Fe Fumarate-FA (PRENATAL MULTIVITAMIN) TABS tablet Take 1 tablet by mouth daily at 12 noon.   Not Taking   Results for orders placed or performed during the hospital encounter of  07/23/16 (from the past 48 hour(s))  Urinalysis, Routine w reflex microscopic (not at [facility])     Status: Abnormal   Collection Time: 07/23/16  9:50 AM  Result Value Ref Range   Color, Urine YELLOW YELLOW   APPearance HAZY (A) CLEAR   Specific Gravity, Urine 1.010 1.005 - 1.030   pH 6.5 5.0 - 8.0   Glucose, UA NEGATIVE NEGATIVE mg/dL   Hgb urine dipstick LARGE (A) NEGATIVE   Bilirubin Urine NEGATIVE NEGATIVE   Ketones, ur NEGATIVE NEGATIVE mg/dL   Protein, ur NEGATIVE NEGATIVE mg/dL   Nitrite NEGATIVE NEGATIVE   Leukocytes, UA NEGATIVE NEGATIVE  Urine microscopic-add on     Status: Abnormal   Collection Time: 07/23/16  9:50 AM  Result Value Ref Range   Squamous Epithelial / LPF 0-5 (A) NONE SEEN   WBC, UA 0-5 0 - 5 WBC/hpf   RBC / HPF 0-5 0 - 5 RBC/hpf   Bacteria, UA RARE (A) NONE SEEN  Pregnancy, urine POC     Status: None   Collection Time: 07/23/16  9:56 AM  Result Value Ref Range   Preg  Test, Ur NEGATIVE NEGATIVE    Comment:        THE SENSITIVITY OF THIS METHODOLOGY IS >24 mIU/mL     Review of Systems  Musculoskeletal: Positive for joint pain and myalgias. Negative for back pain, falls and neck pain.   Physical Exam   Blood pressure 104/56, pulse 78, temperature 98.2 F (36.8 C), temperature source Oral, resp. rate 18, last menstrual period 07/23/2016, currently breastfeeding.  Physical Exam  Constitutional: She is oriented to person, place, and time. She appears well-developed and well-nourished. No distress.  HENT:  Head: Normocephalic.  Eyes: Pupils are equal, round, and reactive to light.  Musculoskeletal: Normal range of motion.       Right hip: She exhibits tenderness and bony tenderness. She exhibits normal range of motion, normal strength, no swelling, no crepitus, no deformity and no laceration.  Neurological: She is alert and oriented to person, place, and time.  Skin: Skin is warm. She is not diaphoretic.  Psychiatric: Her behavior is normal.    MAU  Course  Procedures  None  MDM  Toradol 60 mg IM Flexeril 10 mg PO Patient rates her pain 0/10 at the time of discharge.   Assessment and Plan    A:  1. Hip pain, acute, right     P:  Discharge home in stable condition Rx: Flexeril Ok to continue ibuprofen as directed on the bottle.  Return to MAU for emergencies only If pain worsens go to [facility] or [facility] ED Encouraged patient to see a PCP for this problem; a list of providers given to the patient  Heat therapy discussed.    Duane Lope, NP 07/23/2016 11:43 AM

## 2016-12-12 ENCOUNTER — Encounter (HOSPITAL_COMMUNITY): Payer: Self-pay

## 2016-12-12 ENCOUNTER — Inpatient Hospital Stay (HOSPITAL_COMMUNITY)
Admission: AD | Admit: 2016-12-12 | Discharge: 2016-12-12 | Disposition: A | Discharge status: Home / Self Care | Payer: BLUE CROSS/BLUE SHIELD | Source: Ambulatory Visit | Attending: Obstetrics and Gynecology | Admitting: Obstetrics and Gynecology

## 2016-12-12 DIAGNOSIS — Z3202 Encounter for pregnancy test, result negative: Secondary | ICD-10-CM

## 2016-12-12 DIAGNOSIS — N920 Excessive and frequent menstruation with regular cycle: Secondary | ICD-10-CM

## 2016-12-12 LAB — CBC
HEMATOCRIT: 33.3 % — AB (ref 36.0–46.0)
HEMOGLOBIN: 10.5 g/dL — AB (ref 12.0–15.0)
MCH: 25.2 pg — AB (ref 26.0–34.0)
MCHC: 31.5 g/dL (ref 30.0–36.0)
MCV: 80 fL (ref 78.0–100.0)
Platelets: 274 10*3/uL (ref 150–400)
RBC: 4.16 MIL/uL (ref 3.87–5.11)
RDW: 13.9 % (ref 11.5–15.5)
WBC: 4.5 10*3/uL (ref 4.0–10.5)

## 2016-12-12 LAB — URINALYSIS, MICROSCOPIC (REFLEX): BACTERIA UA: NONE SEEN

## 2016-12-12 LAB — URINALYSIS, ROUTINE W REFLEX MICROSCOPIC
BILIRUBIN URINE: NEGATIVE
GLUCOSE, UA: NEGATIVE mg/dL
KETONES UR: NEGATIVE mg/dL
Nitrite: NEGATIVE
PROTEIN: 100 mg/dL — AB
Specific Gravity, Urine: 1.01 (ref 1.005–1.030)
pH: 7 (ref 5.0–8.0)

## 2016-12-12 LAB — HCG, SERUM, QUALITATIVE: PREG SERUM: NEGATIVE

## 2016-12-12 LAB — POCT PREGNANCY, URINE: PREG TEST UR: NEGATIVE

## 2016-12-12 NOTE — MAU Note (Signed)
Patient presents with excessive vaginal bleeding which started on 12/21 her period was a little late was supposed to start a week earlier, having a little bit of cramping, changing a pad 5 times a day.

## 2016-12-12 NOTE — Discharge Instructions (Signed)
Menorrhagia °Menorrhagia is the medical term for when your menstrual periods are heavy or last longer than usual. With menorrhagia, every period you have may cause enough blood loss and cramping that you are unable to maintain your usual activities. °CAUSES  °In some cases, the cause of heavy periods is unknown, but a number of conditions may cause menorrhagia. Common causes include: °· A problem with the hormone-producing thyroid gland (hypothyroid). °· Noncancerous growths in the uterus (polyps or fibroids). °· An imbalance of the estrogen and progesterone hormones. °· One of your ovaries not releasing an egg during one or more months. °· Side effects of having an intrauterine device (IUD). °· Side effects of some medicines, such as anti-inflammatory medicines or blood thinners. °· A bleeding disorder that stops your blood from clotting normally. °SIGNS AND SYMPTOMS  °During a normal period, bleeding lasts between 4 and 8 days. Signs that your periods are too heavy include: °· You routinely have to change your pad or tampon every 1 or 2 hours because it is completely soaked. °· You pass blood clots larger than 1 inch (2.5 cm) in size. °· You have bleeding for more than 7 days. °· You need to use pads and tampons at the same time because of heavy bleeding. °· You need to wake up to change your pads or tampons during the night. °· You have symptoms of anemia, such as tiredness, fatigue, or shortness of breath.  °DIAGNOSIS  °Your health care provider will perform a physical exam and ask you questions about your symptoms and menstrual history. Other tests may be ordered based on what the health care provider finds during the exam. These tests can include: °· Blood tests. Blood tests are used to check if you are pregnant or have hormonal changes, a bleeding or thyroid disorder, low iron levels (anemia), or other problems. °· Endometrial biopsy. Your health care provider takes a sample of tissue from the inside of your  uterus to be examined under a microscope. °· Pelvic ultrasound. This test uses sound waves to make a picture of your uterus, ovaries, and vagina. The pictures can show if you have fibroids or other growths. °· Hysteroscopy. For this test, your health care provider will use a small telescope to look inside your uterus. °Based on the results of your initial tests, your health care provider may recommend further testing. °TREATMENT  °Treatment may not be needed. If it is needed, your health care provider may recommend treatment with one or more medicines first. If these do not reduce bleeding enough, a surgical treatment might be an option. The best treatment for you will depend on:  °· Whether you need to prevent pregnancy.   °· Your desire to have children in the future. °· The cause and severity of your bleeding. °· Your opinion and personal preference.   °Medicines for menorrhagia may include: °· Birth control methods that use hormones. These include the pill, skin patch, vaginal ring, shots that you get every 3 months, hormonal IUD, and implant. These treatments reduce bleeding during your menstrual period. °· Medicines that thicken blood and slow bleeding. °· Medicines that reduce swelling, such as ibuprofen.  °· Medicines that contain a synthetic hormone called progestin.   °· Medicines that make the ovaries stop working for a short time.   °You may need surgical treatment for menorrhagia if the medicines are unsuccessful. Treatment options include: °· Dilation and curettage (D&C). In this procedure, your health care provider opens (dilates) your cervix and then scrapes or suctions tissue from   the lining of your uterus to reduce menstrual bleeding. °· Operative hysteroscopy. This procedure uses a tiny tube with a light (hysteroscope) to view your uterine cavity and can help in the surgical removal of a polyp that may be causing heavy periods. °· Endometrial ablation. Through various techniques, your health care  provider permanently destroys the entire lining of your uterus (endometrium). After endometrial ablation, most women have little or no menstrual flow. Endometrial ablation reduces your ability to become pregnant. °· Endometrial resection. This surgical procedure uses an electrosurgical wire loop to remove the lining of the uterus. This procedure also reduces your ability to become pregnant. °· Hysterectomy. Surgical removal of the uterus and cervix is a permanent procedure that stops menstrual periods. Pregnancy is not possible after a hysterectomy. This procedure requires anesthesia and hospitalization. °HOME CARE INSTRUCTIONS  °· Only take over-the-counter or prescription medicines as directed by your health care provider. Take prescribed medicines exactly as directed. Do not change or switch medicines without consulting your health care provider. °· Take any prescribed iron pills exactly as directed by your health care provider. Long-term heavy bleeding may result in low iron levels. Iron pills help replace the iron your body lost from heavy bleeding. Iron may cause constipation. If this becomes a problem, increase the bran, fruits, and roughage in your diet. °· Do not take aspirin or medicines that contain aspirin 1 week before or during your menstrual period. Aspirin may make the bleeding worse. °· If you need to change your sanitary pad or tampon more than once every 2 hours, stay in bed and rest as much as possible until the bleeding stops. °· Eat well-balanced meals. Eat foods high in iron. Examples are leafy green vegetables, meat, liver, eggs, and whole grain breads and cereals. Do not try to lose weight until the abnormal bleeding has stopped and your blood iron level is back to normal. °SEEK MEDICAL CARE IF:  °· You soak through a pad or tampon every 1 or 2 hours, and this happens every time you have a period. °· You need to use pads and tampons at the same time because you are bleeding so much. °· You  need to change your pad or tampon during the night. °· You have a period that lasts for more than 8 days. °· You pass clots bigger than 1 inch wide. °· You have irregular periods that happen more or less often than once a month. °· You feel dizzy or faint. °· You feel very weak or tired. °· You feel short of breath or feel your heart is beating too fast when you exercise. °· You have nausea and vomiting or diarrhea while you are taking your medicine. °· You have any problems that may be related to the medicine you are taking. °SEEK IMMEDIATE MEDICAL CARE IF:  °· You soak through 4 or more pads or tampons in 2 hours. °· You have any bleeding while you are pregnant. °MAKE SURE YOU:  °· Understand these instructions. °· Will watch your condition. °· Will get help right away if you are not doing well or get worse. °This information is not intended to replace advice given to you by your health care provider. Make sure you discuss any questions you have with your health care provider. °Document Released: 11/29/2005 Document Revised: 03/22/2016 Document Reviewed: 05/20/2013 °[employer]vier Interactive Patient Education © 2017 [employer] ° °

## 2016-12-12 NOTE — [facility] Provider Note (Signed)
History     CSN: [id number]  Arrival date and time: 12/12/16 [id number]   First Provider Initiated Contact with Patient 12/12/16 1126      Chief Complaint  Patient presents with  . Vaginal Bleeding   HPI  Sheena Hernandez is a 35 y.o. [id number] female who presents for vaginal bleeding. States her current bleeding episode started 1 week later than expected. Menses should have started 12/14-12/16 but she didn't start bleeding until 12/21. Has bled everyday since 12/21. States bleeding is heavier than a normal menses for her and she has passed some clots. Not saturating pads. Concerned that she was pregnant but didn't take pregnancy test at home. Denies abdominal pain, CP, SOB, palpitations, headache, dizziness, vaginal discharge. No contraception.   Pertinent Gynecological History: Menses: regular every month without intermenstrual spotting and usually lasting 3 to 4 days Contraception: none Blood transfusions: none Sexually transmitted diseases: no past history Last pap: normal Date: 2014   Past Medical History:  Diagnosis Date  . Ovarian cyst in pregnancy 08/2011   Resolved     Past Surgical History:  Procedure Laterality Date  . CESAREAN SECTION      History reviewed. No pertinent family history.  Social History  Substance Use Topics  . Smoking status: Never Smoker  . Smokeless tobacco: Never Used  . Alcohol use No    Allergies:  Allergies  Allergen Reactions  . 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      No prescriptions prior to admission.    Review of Systems  Constitutional: Negative.   Respiratory: Negative for shortness of breath.   Cardiovascular: Negative for chest pain and palpitations.  Gastrointestinal: Negative.   Genitourinary: Negative for dysuria.       + vaginal bleeding  Neurological: Negative for dizziness and headaches.   Physical Exam   Blood pressure 98/64, pulse 69, temperature 97.7 F (36.5 C),  temperature source Oral, resp. rate 18, height 5' 7.5" (1.715 m), weight 197 lb (89.4 kg), last menstrual period 12/02/2016, currently breastfeeding.  Physical Exam  Nursing note and vitals reviewed. Constitutional: She is oriented to person, place, and time. She appears well-developed and well-nourished. No distress.  HENT:  Head: Normocephalic and atraumatic.  Eyes: Conjunctivae are normal. Right eye exhibits no discharge. Left eye exhibits no discharge. No scleral icterus.  Neck: Normal range of motion.  Cardiovascular: Normal rate, regular rhythm and normal heart sounds.   No murmur heard. Respiratory: Effort normal and breath sounds normal. No respiratory distress. She has no wheezes.  GI: Soft. Bowel sounds are normal. She exhibits no distension. There is no tenderness. There is no rebound.  Genitourinary: Uterus normal. Cervix exhibits no friability. There is bleeding (small amount of dark red blood; no active bleeding) in the vagina.  Neurological: She is alert and oriented to person, place, and time.  Skin: Skin is warm and dry. She is not diaphoretic.  Psychiatric: She has a normal mood and affect. Her behavior is normal. Judgment and thought content normal.    [facility] Course  Procedures Results for orders placed or performed during the hospital encounter of 12/12/16 (from the past 24 hour(s))  Urinalysis, Routine w reflex microscopic     Status: Abnormal   Collection Time: 12/12/16 10:10 AM  Result Value Ref Range   Color, Urine RED (A) YELLOW   APPearance HAZY (A) CLEAR   Specific Gravity, Urine 1.010 1.005 - 1.030   pH 7.0  5.0 - 8.0   Glucose, UA NEGATIVE NEGATIVE mg/dL   Hgb urine dipstick LARGE (A) NEGATIVE   Bilirubin Urine NEGATIVE NEGATIVE   Ketones, ur NEGATIVE NEGATIVE mg/dL   Protein, ur 161100 (A) NEGATIVE mg/dL   Nitrite NEGATIVE NEGATIVE   Leukocytes, UA TRACE (A) NEGATIVE  Urinalysis, Microscopic (reflex)     Status: Abnormal   Collection Time: 12/12/16 10:10 AM   Result Value Ref Range   RBC / HPF TOO NUMEROUS TO COUNT 0 - 5 RBC/hpf   WBC, UA 0-5 0 - 5 WBC/hpf   Bacteria, UA NONE SEEN NONE SEEN   Squamous Epithelial / LPF 0-5 (A) NONE SEEN  Pregnancy, urine POC     Status: None   Collection Time: 12/12/16 10:23 AM  Result Value Ref Range   Preg Test, Ur NEGATIVE NEGATIVE  CBC     Status: Abnormal   Collection Time: 12/12/16 10:59 AM  Result Value Ref Range   WBC 4.5 4.0 - 10.5 K/uL   RBC 4.16 3.87 - 5.11 MIL/uL   Hemoglobin 10.5 (L) 12.0 - 15.0 g/dL   HCT 09.633.3 (L) 04.536.0 - 40.946.0 %   MCV 80.0 78.0 - 100.0 fL   MCH 25.2 (L) 26.0 - 34.0 pg   MCHC 31.5 30.0 - 36.0 g/dL   RDW 81.113.9 91.411.5 - 78.215.5 %   Platelets 274 150 - 400 K/uL  hCG, serum, qualitative     Status: None   Collection Time: 12/12/16 11:48 AM  Result Value Ref Range   Preg, Serum NEGATIVE NEGATIVE    MDM UPT negative CBC -- hemoglobin stable VSS Pt & spouse insistent on serum pregnancy test --- qualitative BHCG negative  Assessment and Plan  A: 1. Menorrhagia with regular cycle   2. Negative pregnancy test    P: [facility] or sooner if bleeding continues Discussed reasons to return to [facility]  Judeth HornErin Khushi Zupko 12/12/2016, 11:07 AM
